# Patient Record
Sex: Male | Born: 1945 | Race: White | Marital: Single | Smoking: Former smoker
Health system: Southern US, Community
[De-identification: ages and names within clinical notes are randomized; demographics above are authoritative.]

## PROBLEM LIST (undated history)

## (undated) DIAGNOSIS — F319 Bipolar disorder, unspecified: Secondary | ICD-10-CM

## (undated) DIAGNOSIS — N200 Calculus of kidney: Secondary | ICD-10-CM

## (undated) DIAGNOSIS — N4289 Other specified disorders of prostate: Secondary | ICD-10-CM

## (undated) DIAGNOSIS — J449 Chronic obstructive pulmonary disease, unspecified: Secondary | ICD-10-CM

## (undated) DIAGNOSIS — N289 Disorder of kidney and ureter, unspecified: Secondary | ICD-10-CM

## (undated) DIAGNOSIS — I10 Essential (primary) hypertension: Secondary | ICD-10-CM

## (undated) DIAGNOSIS — E119 Type 2 diabetes mellitus without complications: Secondary | ICD-10-CM

## (undated) HISTORY — PX: WRIST SURGERY: SHX841

## (undated) HISTORY — PX: NASAL SINUS SURGERY: SHX719

## (undated) HISTORY — PX: APPENDECTOMY: SHX54

---

## 2011-02-27 DIAGNOSIS — F319 Bipolar disorder, unspecified: Secondary | ICD-10-CM | POA: Diagnosis not present

## 2011-02-27 DIAGNOSIS — J449 Chronic obstructive pulmonary disease, unspecified: Secondary | ICD-10-CM | POA: Diagnosis not present

## 2011-02-27 DIAGNOSIS — F411 Generalized anxiety disorder: Secondary | ICD-10-CM | POA: Diagnosis not present

## 2011-02-27 DIAGNOSIS — E785 Hyperlipidemia, unspecified: Secondary | ICD-10-CM | POA: Diagnosis not present

## 2011-02-27 DIAGNOSIS — I10 Essential (primary) hypertension: Secondary | ICD-10-CM | POA: Diagnosis not present

## 2011-03-05 DIAGNOSIS — G459 Transient cerebral ischemic attack, unspecified: Secondary | ICD-10-CM | POA: Diagnosis not present

## 2011-03-05 DIAGNOSIS — M5137 Other intervertebral disc degeneration, lumbosacral region: Secondary | ICD-10-CM | POA: Diagnosis not present

## 2011-03-05 DIAGNOSIS — R262 Difficulty in walking, not elsewhere classified: Secondary | ICD-10-CM | POA: Diagnosis not present

## 2011-03-06 DIAGNOSIS — M5137 Other intervertebral disc degeneration, lumbosacral region: Secondary | ICD-10-CM | POA: Diagnosis not present

## 2011-03-06 DIAGNOSIS — R262 Difficulty in walking, not elsewhere classified: Secondary | ICD-10-CM | POA: Diagnosis not present

## 2011-03-06 DIAGNOSIS — R279 Unspecified lack of coordination: Secondary | ICD-10-CM | POA: Diagnosis not present

## 2011-03-06 DIAGNOSIS — IMO0001 Reserved for inherently not codable concepts without codable children: Secondary | ICD-10-CM | POA: Diagnosis not present

## 2011-03-06 DIAGNOSIS — M6281 Muscle weakness (generalized): Secondary | ICD-10-CM | POA: Diagnosis not present

## 2011-03-06 DIAGNOSIS — M545 Low back pain, unspecified: Secondary | ICD-10-CM | POA: Diagnosis not present

## 2011-03-06 DIAGNOSIS — M2569 Stiffness of other specified joint, not elsewhere classified: Secondary | ICD-10-CM | POA: Diagnosis not present

## 2011-03-06 DIAGNOSIS — R293 Abnormal posture: Secondary | ICD-10-CM | POA: Diagnosis not present

## 2011-05-30 DIAGNOSIS — I10 Essential (primary) hypertension: Secondary | ICD-10-CM | POA: Diagnosis not present

## 2011-05-30 DIAGNOSIS — N400 Enlarged prostate without lower urinary tract symptoms: Secondary | ICD-10-CM | POA: Diagnosis not present

## 2011-05-30 DIAGNOSIS — E785 Hyperlipidemia, unspecified: Secondary | ICD-10-CM | POA: Diagnosis not present

## 2011-06-11 DIAGNOSIS — H109 Unspecified conjunctivitis: Secondary | ICD-10-CM | POA: Diagnosis not present

## 2011-06-12 DIAGNOSIS — F3189 Other bipolar disorder: Secondary | ICD-10-CM | POA: Diagnosis not present

## 2011-08-27 DIAGNOSIS — E785 Hyperlipidemia, unspecified: Secondary | ICD-10-CM | POA: Diagnosis not present

## 2011-08-27 DIAGNOSIS — I10 Essential (primary) hypertension: Secondary | ICD-10-CM | POA: Diagnosis not present

## 2011-08-27 DIAGNOSIS — J449 Chronic obstructive pulmonary disease, unspecified: Secondary | ICD-10-CM | POA: Diagnosis not present

## 2011-08-27 DIAGNOSIS — H9190 Unspecified hearing loss, unspecified ear: Secondary | ICD-10-CM | POA: Diagnosis not present

## 2011-08-29 DIAGNOSIS — R911 Solitary pulmonary nodule: Secondary | ICD-10-CM | POA: Diagnosis not present

## 2011-08-31 DIAGNOSIS — H903 Sensorineural hearing loss, bilateral: Secondary | ICD-10-CM | POA: Diagnosis not present

## 2011-09-04 DIAGNOSIS — F3189 Other bipolar disorder: Secondary | ICD-10-CM | POA: Diagnosis not present

## 2011-11-12 DIAGNOSIS — Z23 Encounter for immunization: Secondary | ICD-10-CM | POA: Diagnosis not present

## 2011-11-13 DIAGNOSIS — F3189 Other bipolar disorder: Secondary | ICD-10-CM | POA: Diagnosis not present

## 2011-11-26 DIAGNOSIS — J449 Chronic obstructive pulmonary disease, unspecified: Secondary | ICD-10-CM | POA: Diagnosis not present

## 2011-11-26 DIAGNOSIS — I10 Essential (primary) hypertension: Secondary | ICD-10-CM | POA: Diagnosis not present

## 2011-11-26 DIAGNOSIS — F319 Bipolar disorder, unspecified: Secondary | ICD-10-CM | POA: Diagnosis not present

## 2011-11-26 DIAGNOSIS — K219 Gastro-esophageal reflux disease without esophagitis: Secondary | ICD-10-CM | POA: Diagnosis not present

## 2011-11-26 DIAGNOSIS — E785 Hyperlipidemia, unspecified: Secondary | ICD-10-CM | POA: Diagnosis not present

## 2011-11-26 DIAGNOSIS — M545 Low back pain: Secondary | ICD-10-CM | POA: Diagnosis not present

## 2011-11-26 DIAGNOSIS — H9190 Unspecified hearing loss, unspecified ear: Secondary | ICD-10-CM | POA: Diagnosis not present

## 2011-12-20 DIAGNOSIS — L219 Seborrheic dermatitis, unspecified: Secondary | ICD-10-CM | POA: Diagnosis not present

## 2012-01-17 DIAGNOSIS — R0789 Other chest pain: Secondary | ICD-10-CM | POA: Diagnosis not present

## 2012-01-17 DIAGNOSIS — F411 Generalized anxiety disorder: Secondary | ICD-10-CM | POA: Diagnosis not present

## 2012-01-17 DIAGNOSIS — M62838 Other muscle spasm: Secondary | ICD-10-CM | POA: Diagnosis not present

## 2012-02-28 DIAGNOSIS — G459 Transient cerebral ischemic attack, unspecified: Secondary | ICD-10-CM | POA: Diagnosis not present

## 2012-03-03 DIAGNOSIS — F411 Generalized anxiety disorder: Secondary | ICD-10-CM | POA: Diagnosis not present

## 2012-03-03 DIAGNOSIS — F410 Panic disorder [episodic paroxysmal anxiety] without agoraphobia: Secondary | ICD-10-CM | POA: Diagnosis not present

## 2012-03-03 DIAGNOSIS — F319 Bipolar disorder, unspecified: Secondary | ICD-10-CM | POA: Diagnosis not present

## 2012-03-03 DIAGNOSIS — K219 Gastro-esophageal reflux disease without esophagitis: Secondary | ICD-10-CM | POA: Diagnosis not present

## 2012-03-03 DIAGNOSIS — I10 Essential (primary) hypertension: Secondary | ICD-10-CM | POA: Diagnosis not present

## 2012-03-03 DIAGNOSIS — E785 Hyperlipidemia, unspecified: Secondary | ICD-10-CM | POA: Diagnosis not present

## 2012-04-07 DIAGNOSIS — Z8673 Personal history of transient ischemic attack (TIA), and cerebral infarction without residual deficits: Secondary | ICD-10-CM | POA: Diagnosis not present

## 2012-04-07 DIAGNOSIS — I10 Essential (primary) hypertension: Secondary | ICD-10-CM | POA: Diagnosis not present

## 2012-04-07 DIAGNOSIS — F410 Panic disorder [episodic paroxysmal anxiety] without agoraphobia: Secondary | ICD-10-CM | POA: Diagnosis not present

## 2012-04-07 DIAGNOSIS — E785 Hyperlipidemia, unspecified: Secondary | ICD-10-CM | POA: Diagnosis not present

## 2012-04-07 DIAGNOSIS — I200 Unstable angina: Secondary | ICD-10-CM | POA: Diagnosis not present

## 2012-04-07 DIAGNOSIS — J440 Chronic obstructive pulmonary disease with acute lower respiratory infection: Secondary | ICD-10-CM | POA: Diagnosis not present

## 2012-04-07 DIAGNOSIS — K219 Gastro-esophageal reflux disease without esophagitis: Secondary | ICD-10-CM | POA: Diagnosis not present

## 2012-04-07 DIAGNOSIS — R072 Precordial pain: Secondary | ICD-10-CM | POA: Diagnosis not present

## 2012-04-07 DIAGNOSIS — I739 Peripheral vascular disease, unspecified: Secondary | ICD-10-CM | POA: Diagnosis not present

## 2012-04-07 DIAGNOSIS — J441 Chronic obstructive pulmonary disease with (acute) exacerbation: Secondary | ICD-10-CM | POA: Diagnosis not present

## 2012-04-07 DIAGNOSIS — Z79899 Other long term (current) drug therapy: Secondary | ICD-10-CM | POA: Diagnosis not present

## 2012-04-07 DIAGNOSIS — Z905 Acquired absence of kidney: Secondary | ICD-10-CM | POA: Diagnosis not present

## 2012-04-07 DIAGNOSIS — Z7902 Long term (current) use of antithrombotics/antiplatelets: Secondary | ICD-10-CM | POA: Diagnosis not present

## 2012-04-07 DIAGNOSIS — R0789 Other chest pain: Secondary | ICD-10-CM | POA: Diagnosis not present

## 2012-04-07 DIAGNOSIS — F17200 Nicotine dependence, unspecified, uncomplicated: Secondary | ICD-10-CM | POA: Diagnosis not present

## 2012-04-08 DIAGNOSIS — F17200 Nicotine dependence, unspecified, uncomplicated: Secondary | ICD-10-CM | POA: Diagnosis not present

## 2012-04-08 DIAGNOSIS — J440 Chronic obstructive pulmonary disease with acute lower respiratory infection: Secondary | ICD-10-CM | POA: Diagnosis not present

## 2012-04-08 DIAGNOSIS — R0789 Other chest pain: Secondary | ICD-10-CM | POA: Diagnosis not present

## 2012-04-08 DIAGNOSIS — R9431 Abnormal electrocardiogram [ECG] [EKG]: Secondary | ICD-10-CM | POA: Diagnosis not present

## 2012-04-22 DIAGNOSIS — J449 Chronic obstructive pulmonary disease, unspecified: Secondary | ICD-10-CM | POA: Diagnosis not present

## 2012-04-22 DIAGNOSIS — I209 Angina pectoris, unspecified: Secondary | ICD-10-CM | POA: Diagnosis not present

## 2012-04-30 DIAGNOSIS — I10 Essential (primary) hypertension: Secondary | ICD-10-CM | POA: Diagnosis not present

## 2012-04-30 DIAGNOSIS — J449 Chronic obstructive pulmonary disease, unspecified: Secondary | ICD-10-CM | POA: Diagnosis not present

## 2012-04-30 DIAGNOSIS — R079 Chest pain, unspecified: Secondary | ICD-10-CM | POA: Diagnosis not present

## 2012-06-02 DIAGNOSIS — I10 Essential (primary) hypertension: Secondary | ICD-10-CM | POA: Diagnosis not present

## 2012-06-02 DIAGNOSIS — J449 Chronic obstructive pulmonary disease, unspecified: Secondary | ICD-10-CM | POA: Diagnosis not present

## 2012-06-02 DIAGNOSIS — F17200 Nicotine dependence, unspecified, uncomplicated: Secondary | ICD-10-CM | POA: Diagnosis not present

## 2012-06-02 DIAGNOSIS — E785 Hyperlipidemia, unspecified: Secondary | ICD-10-CM | POA: Diagnosis not present

## 2012-08-08 DIAGNOSIS — G459 Transient cerebral ischemic attack, unspecified: Secondary | ICD-10-CM | POA: Diagnosis not present

## 2012-08-08 DIAGNOSIS — IMO0001 Reserved for inherently not codable concepts without codable children: Secondary | ICD-10-CM | POA: Diagnosis not present

## 2012-09-01 DIAGNOSIS — IMO0001 Reserved for inherently not codable concepts without codable children: Secondary | ICD-10-CM | POA: Diagnosis not present

## 2012-09-01 DIAGNOSIS — I10 Essential (primary) hypertension: Secondary | ICD-10-CM | POA: Diagnosis not present

## 2012-09-01 DIAGNOSIS — R5383 Other fatigue: Secondary | ICD-10-CM | POA: Diagnosis not present

## 2012-09-01 DIAGNOSIS — J449 Chronic obstructive pulmonary disease, unspecified: Secondary | ICD-10-CM | POA: Diagnosis not present

## 2012-09-01 DIAGNOSIS — E785 Hyperlipidemia, unspecified: Secondary | ICD-10-CM | POA: Diagnosis not present

## 2012-09-01 DIAGNOSIS — R269 Unspecified abnormalities of gait and mobility: Secondary | ICD-10-CM | POA: Diagnosis not present

## 2012-09-01 DIAGNOSIS — R5381 Other malaise: Secondary | ICD-10-CM | POA: Diagnosis not present

## 2012-11-12 DIAGNOSIS — Z23 Encounter for immunization: Secondary | ICD-10-CM | POA: Diagnosis not present

## 2012-12-01 DIAGNOSIS — E785 Hyperlipidemia, unspecified: Secondary | ICD-10-CM | POA: Diagnosis not present

## 2012-12-01 DIAGNOSIS — F319 Bipolar disorder, unspecified: Secondary | ICD-10-CM | POA: Diagnosis not present

## 2012-12-01 DIAGNOSIS — N289 Disorder of kidney and ureter, unspecified: Secondary | ICD-10-CM | POA: Diagnosis not present

## 2012-12-01 DIAGNOSIS — I10 Essential (primary) hypertension: Secondary | ICD-10-CM | POA: Diagnosis not present

## 2012-12-01 DIAGNOSIS — Z23 Encounter for immunization: Secondary | ICD-10-CM | POA: Diagnosis not present

## 2012-12-01 DIAGNOSIS — K219 Gastro-esophageal reflux disease without esophagitis: Secondary | ICD-10-CM | POA: Diagnosis not present

## 2012-12-05 DIAGNOSIS — I714 Abdominal aortic aneurysm, without rupture: Secondary | ICD-10-CM | POA: Diagnosis not present

## 2012-12-05 DIAGNOSIS — I722 Aneurysm of renal artery: Secondary | ICD-10-CM | POA: Diagnosis not present

## 2013-03-06 DIAGNOSIS — I10 Essential (primary) hypertension: Secondary | ICD-10-CM | POA: Diagnosis not present

## 2013-03-06 DIAGNOSIS — Z125 Encounter for screening for malignant neoplasm of prostate: Secondary | ICD-10-CM | POA: Diagnosis not present

## 2013-03-06 DIAGNOSIS — M549 Dorsalgia, unspecified: Secondary | ICD-10-CM | POA: Diagnosis not present

## 2013-03-06 DIAGNOSIS — K219 Gastro-esophageal reflux disease without esophagitis: Secondary | ICD-10-CM | POA: Diagnosis not present

## 2013-03-06 DIAGNOSIS — N289 Disorder of kidney and ureter, unspecified: Secondary | ICD-10-CM | POA: Diagnosis not present

## 2013-03-06 DIAGNOSIS — F319 Bipolar disorder, unspecified: Secondary | ICD-10-CM | POA: Diagnosis not present

## 2013-03-06 DIAGNOSIS — E785 Hyperlipidemia, unspecified: Secondary | ICD-10-CM | POA: Diagnosis not present

## 2013-07-09 DIAGNOSIS — F3189 Other bipolar disorder: Secondary | ICD-10-CM | POA: Diagnosis not present

## 2013-07-09 DIAGNOSIS — E785 Hyperlipidemia, unspecified: Secondary | ICD-10-CM | POA: Diagnosis not present

## 2013-07-09 DIAGNOSIS — F319 Bipolar disorder, unspecified: Secondary | ICD-10-CM | POA: Diagnosis not present

## 2013-07-09 DIAGNOSIS — I10 Essential (primary) hypertension: Secondary | ICD-10-CM | POA: Diagnosis not present

## 2013-07-09 DIAGNOSIS — K219 Gastro-esophageal reflux disease without esophagitis: Secondary | ICD-10-CM | POA: Diagnosis not present

## 2013-07-09 DIAGNOSIS — N289 Disorder of kidney and ureter, unspecified: Secondary | ICD-10-CM | POA: Diagnosis not present

## 2013-07-15 DIAGNOSIS — Z1211 Encounter for screening for malignant neoplasm of colon: Secondary | ICD-10-CM | POA: Diagnosis not present

## 2013-08-07 DIAGNOSIS — R42 Dizziness and giddiness: Secondary | ICD-10-CM | POA: Diagnosis not present

## 2013-08-07 DIAGNOSIS — G459 Transient cerebral ischemic attack, unspecified: Secondary | ICD-10-CM | POA: Diagnosis not present

## 2013-10-28 DIAGNOSIS — Z23 Encounter for immunization: Secondary | ICD-10-CM | POA: Diagnosis not present

## 2013-11-06 DIAGNOSIS — F319 Bipolar disorder, unspecified: Secondary | ICD-10-CM | POA: Diagnosis not present

## 2013-11-06 DIAGNOSIS — K219 Gastro-esophageal reflux disease without esophagitis: Secondary | ICD-10-CM | POA: Diagnosis not present

## 2013-11-06 DIAGNOSIS — IMO0002 Reserved for concepts with insufficient information to code with codable children: Secondary | ICD-10-CM | POA: Diagnosis not present

## 2013-11-06 DIAGNOSIS — I10 Essential (primary) hypertension: Secondary | ICD-10-CM | POA: Diagnosis not present

## 2013-11-06 DIAGNOSIS — E785 Hyperlipidemia, unspecified: Secondary | ICD-10-CM | POA: Diagnosis not present

## 2014-01-21 DIAGNOSIS — Z6830 Body mass index (BMI) 30.0-30.9, adult: Secondary | ICD-10-CM | POA: Diagnosis not present

## 2014-01-21 DIAGNOSIS — K219 Gastro-esophageal reflux disease without esophagitis: Secondary | ICD-10-CM | POA: Diagnosis not present

## 2014-03-10 DIAGNOSIS — Z6824 Body mass index (BMI) 24.0-24.9, adult: Secondary | ICD-10-CM | POA: Diagnosis not present

## 2014-03-10 DIAGNOSIS — E785 Hyperlipidemia, unspecified: Secondary | ICD-10-CM | POA: Diagnosis not present

## 2014-03-10 DIAGNOSIS — I10 Essential (primary) hypertension: Secondary | ICD-10-CM | POA: Diagnosis not present

## 2014-03-10 DIAGNOSIS — K219 Gastro-esophageal reflux disease without esophagitis: Secondary | ICD-10-CM | POA: Diagnosis not present

## 2014-05-24 DIAGNOSIS — K219 Gastro-esophageal reflux disease without esophagitis: Secondary | ICD-10-CM | POA: Diagnosis not present

## 2014-05-24 DIAGNOSIS — Z6825 Body mass index (BMI) 25.0-25.9, adult: Secondary | ICD-10-CM | POA: Diagnosis not present

## 2014-05-24 DIAGNOSIS — R079 Chest pain, unspecified: Secondary | ICD-10-CM | POA: Diagnosis not present

## 2014-05-24 DIAGNOSIS — K224 Dyskinesia of esophagus: Secondary | ICD-10-CM | POA: Diagnosis not present

## 2014-06-10 DIAGNOSIS — F319 Bipolar disorder, unspecified: Secondary | ICD-10-CM | POA: Diagnosis not present

## 2014-06-10 DIAGNOSIS — Z6825 Body mass index (BMI) 25.0-25.9, adult: Secondary | ICD-10-CM | POA: Diagnosis not present

## 2014-06-10 DIAGNOSIS — Z125 Encounter for screening for malignant neoplasm of prostate: Secondary | ICD-10-CM | POA: Diagnosis not present

## 2014-06-10 DIAGNOSIS — J449 Chronic obstructive pulmonary disease, unspecified: Secondary | ICD-10-CM | POA: Diagnosis not present

## 2014-06-10 DIAGNOSIS — Z720 Tobacco use: Secondary | ICD-10-CM | POA: Diagnosis not present

## 2014-06-10 DIAGNOSIS — E785 Hyperlipidemia, unspecified: Secondary | ICD-10-CM | POA: Diagnosis not present

## 2014-06-10 DIAGNOSIS — I10 Essential (primary) hypertension: Secondary | ICD-10-CM | POA: Diagnosis not present

## 2014-08-06 DIAGNOSIS — G459 Transient cerebral ischemic attack, unspecified: Secondary | ICD-10-CM | POA: Diagnosis not present

## 2014-08-06 DIAGNOSIS — R269 Unspecified abnormalities of gait and mobility: Secondary | ICD-10-CM | POA: Diagnosis not present

## 2014-09-08 DIAGNOSIS — R2689 Other abnormalities of gait and mobility: Secondary | ICD-10-CM | POA: Diagnosis not present

## 2014-09-08 DIAGNOSIS — E785 Hyperlipidemia, unspecified: Secondary | ICD-10-CM | POA: Diagnosis not present

## 2014-09-08 DIAGNOSIS — F319 Bipolar disorder, unspecified: Secondary | ICD-10-CM | POA: Diagnosis not present

## 2014-09-08 DIAGNOSIS — G459 Transient cerebral ischemic attack, unspecified: Secondary | ICD-10-CM | POA: Diagnosis not present

## 2014-09-08 DIAGNOSIS — J449 Chronic obstructive pulmonary disease, unspecified: Secondary | ICD-10-CM | POA: Diagnosis not present

## 2014-09-08 DIAGNOSIS — Z6825 Body mass index (BMI) 25.0-25.9, adult: Secondary | ICD-10-CM | POA: Diagnosis not present

## 2014-09-08 DIAGNOSIS — I10 Essential (primary) hypertension: Secondary | ICD-10-CM | POA: Diagnosis not present

## 2014-09-08 DIAGNOSIS — Z9181 History of falling: Secondary | ICD-10-CM | POA: Diagnosis not present

## 2014-09-10 DIAGNOSIS — Z23 Encounter for immunization: Secondary | ICD-10-CM | POA: Diagnosis not present

## 2014-09-17 DIAGNOSIS — J449 Chronic obstructive pulmonary disease, unspecified: Secondary | ICD-10-CM | POA: Diagnosis not present

## 2014-09-17 DIAGNOSIS — F17210 Nicotine dependence, cigarettes, uncomplicated: Secondary | ICD-10-CM | POA: Diagnosis not present

## 2014-09-17 DIAGNOSIS — I10 Essential (primary) hypertension: Secondary | ICD-10-CM | POA: Diagnosis not present

## 2014-09-17 DIAGNOSIS — F319 Bipolar disorder, unspecified: Secondary | ICD-10-CM | POA: Diagnosis not present

## 2014-09-17 DIAGNOSIS — E1140 Type 2 diabetes mellitus with diabetic neuropathy, unspecified: Secondary | ICD-10-CM | POA: Diagnosis not present

## 2014-09-17 DIAGNOSIS — M5030 Other cervical disc degeneration, unspecified cervical region: Secondary | ICD-10-CM | POA: Diagnosis not present

## 2014-09-21 DIAGNOSIS — J449 Chronic obstructive pulmonary disease, unspecified: Secondary | ICD-10-CM | POA: Diagnosis not present

## 2014-09-21 DIAGNOSIS — F319 Bipolar disorder, unspecified: Secondary | ICD-10-CM | POA: Diagnosis not present

## 2014-09-21 DIAGNOSIS — E1140 Type 2 diabetes mellitus with diabetic neuropathy, unspecified: Secondary | ICD-10-CM | POA: Diagnosis not present

## 2014-09-21 DIAGNOSIS — I10 Essential (primary) hypertension: Secondary | ICD-10-CM | POA: Diagnosis not present

## 2014-09-21 DIAGNOSIS — M5030 Other cervical disc degeneration, unspecified cervical region: Secondary | ICD-10-CM | POA: Diagnosis not present

## 2014-09-21 DIAGNOSIS — F17210 Nicotine dependence, cigarettes, uncomplicated: Secondary | ICD-10-CM | POA: Diagnosis not present

## 2014-09-22 DIAGNOSIS — M5030 Other cervical disc degeneration, unspecified cervical region: Secondary | ICD-10-CM | POA: Diagnosis not present

## 2014-09-22 DIAGNOSIS — E1140 Type 2 diabetes mellitus with diabetic neuropathy, unspecified: Secondary | ICD-10-CM | POA: Diagnosis not present

## 2014-09-22 DIAGNOSIS — I10 Essential (primary) hypertension: Secondary | ICD-10-CM | POA: Diagnosis not present

## 2014-09-22 DIAGNOSIS — F17210 Nicotine dependence, cigarettes, uncomplicated: Secondary | ICD-10-CM | POA: Diagnosis not present

## 2014-09-22 DIAGNOSIS — F319 Bipolar disorder, unspecified: Secondary | ICD-10-CM | POA: Diagnosis not present

## 2014-09-22 DIAGNOSIS — J449 Chronic obstructive pulmonary disease, unspecified: Secondary | ICD-10-CM | POA: Diagnosis not present

## 2014-09-27 DIAGNOSIS — I10 Essential (primary) hypertension: Secondary | ICD-10-CM | POA: Diagnosis not present

## 2014-09-28 DIAGNOSIS — E1140 Type 2 diabetes mellitus with diabetic neuropathy, unspecified: Secondary | ICD-10-CM | POA: Diagnosis not present

## 2014-09-28 DIAGNOSIS — F17210 Nicotine dependence, cigarettes, uncomplicated: Secondary | ICD-10-CM | POA: Diagnosis not present

## 2014-09-28 DIAGNOSIS — M5030 Other cervical disc degeneration, unspecified cervical region: Secondary | ICD-10-CM | POA: Diagnosis not present

## 2014-09-28 DIAGNOSIS — F319 Bipolar disorder, unspecified: Secondary | ICD-10-CM | POA: Diagnosis not present

## 2014-09-28 DIAGNOSIS — I10 Essential (primary) hypertension: Secondary | ICD-10-CM | POA: Diagnosis not present

## 2014-09-28 DIAGNOSIS — J449 Chronic obstructive pulmonary disease, unspecified: Secondary | ICD-10-CM | POA: Diagnosis not present

## 2014-09-30 DIAGNOSIS — J449 Chronic obstructive pulmonary disease, unspecified: Secondary | ICD-10-CM | POA: Diagnosis not present

## 2014-09-30 DIAGNOSIS — M5030 Other cervical disc degeneration, unspecified cervical region: Secondary | ICD-10-CM | POA: Diagnosis not present

## 2014-09-30 DIAGNOSIS — E1140 Type 2 diabetes mellitus with diabetic neuropathy, unspecified: Secondary | ICD-10-CM | POA: Diagnosis not present

## 2014-09-30 DIAGNOSIS — F319 Bipolar disorder, unspecified: Secondary | ICD-10-CM | POA: Diagnosis not present

## 2014-09-30 DIAGNOSIS — F17210 Nicotine dependence, cigarettes, uncomplicated: Secondary | ICD-10-CM | POA: Diagnosis not present

## 2014-09-30 DIAGNOSIS — I10 Essential (primary) hypertension: Secondary | ICD-10-CM | POA: Diagnosis not present

## 2014-10-05 DIAGNOSIS — F17210 Nicotine dependence, cigarettes, uncomplicated: Secondary | ICD-10-CM | POA: Diagnosis not present

## 2014-10-05 DIAGNOSIS — J449 Chronic obstructive pulmonary disease, unspecified: Secondary | ICD-10-CM | POA: Diagnosis not present

## 2014-10-05 DIAGNOSIS — E1140 Type 2 diabetes mellitus with diabetic neuropathy, unspecified: Secondary | ICD-10-CM | POA: Diagnosis not present

## 2014-10-05 DIAGNOSIS — F319 Bipolar disorder, unspecified: Secondary | ICD-10-CM | POA: Diagnosis not present

## 2014-10-05 DIAGNOSIS — M5030 Other cervical disc degeneration, unspecified cervical region: Secondary | ICD-10-CM | POA: Diagnosis not present

## 2014-10-05 DIAGNOSIS — I10 Essential (primary) hypertension: Secondary | ICD-10-CM | POA: Diagnosis not present

## 2014-10-07 DIAGNOSIS — M5030 Other cervical disc degeneration, unspecified cervical region: Secondary | ICD-10-CM | POA: Diagnosis not present

## 2014-10-07 DIAGNOSIS — J449 Chronic obstructive pulmonary disease, unspecified: Secondary | ICD-10-CM | POA: Diagnosis not present

## 2014-10-07 DIAGNOSIS — F17210 Nicotine dependence, cigarettes, uncomplicated: Secondary | ICD-10-CM | POA: Diagnosis not present

## 2014-10-07 DIAGNOSIS — F319 Bipolar disorder, unspecified: Secondary | ICD-10-CM | POA: Diagnosis not present

## 2014-10-07 DIAGNOSIS — I10 Essential (primary) hypertension: Secondary | ICD-10-CM | POA: Diagnosis not present

## 2014-10-07 DIAGNOSIS — E1140 Type 2 diabetes mellitus with diabetic neuropathy, unspecified: Secondary | ICD-10-CM | POA: Diagnosis not present

## 2014-10-12 DIAGNOSIS — E1140 Type 2 diabetes mellitus with diabetic neuropathy, unspecified: Secondary | ICD-10-CM | POA: Diagnosis not present

## 2014-10-12 DIAGNOSIS — F17210 Nicotine dependence, cigarettes, uncomplicated: Secondary | ICD-10-CM | POA: Diagnosis not present

## 2014-10-12 DIAGNOSIS — J449 Chronic obstructive pulmonary disease, unspecified: Secondary | ICD-10-CM | POA: Diagnosis not present

## 2014-10-12 DIAGNOSIS — F319 Bipolar disorder, unspecified: Secondary | ICD-10-CM | POA: Diagnosis not present

## 2014-10-12 DIAGNOSIS — I10 Essential (primary) hypertension: Secondary | ICD-10-CM | POA: Diagnosis not present

## 2014-10-12 DIAGNOSIS — M5030 Other cervical disc degeneration, unspecified cervical region: Secondary | ICD-10-CM | POA: Diagnosis not present

## 2014-10-14 DIAGNOSIS — E1140 Type 2 diabetes mellitus with diabetic neuropathy, unspecified: Secondary | ICD-10-CM | POA: Diagnosis not present

## 2014-10-14 DIAGNOSIS — I10 Essential (primary) hypertension: Secondary | ICD-10-CM | POA: Diagnosis not present

## 2014-10-14 DIAGNOSIS — F17210 Nicotine dependence, cigarettes, uncomplicated: Secondary | ICD-10-CM | POA: Diagnosis not present

## 2014-10-14 DIAGNOSIS — F319 Bipolar disorder, unspecified: Secondary | ICD-10-CM | POA: Diagnosis not present

## 2014-10-14 DIAGNOSIS — J449 Chronic obstructive pulmonary disease, unspecified: Secondary | ICD-10-CM | POA: Diagnosis not present

## 2014-10-14 DIAGNOSIS — M5030 Other cervical disc degeneration, unspecified cervical region: Secondary | ICD-10-CM | POA: Diagnosis not present

## 2014-10-20 DIAGNOSIS — M5030 Other cervical disc degeneration, unspecified cervical region: Secondary | ICD-10-CM | POA: Diagnosis not present

## 2014-10-20 DIAGNOSIS — F319 Bipolar disorder, unspecified: Secondary | ICD-10-CM | POA: Diagnosis not present

## 2014-10-20 DIAGNOSIS — J449 Chronic obstructive pulmonary disease, unspecified: Secondary | ICD-10-CM | POA: Diagnosis not present

## 2014-10-20 DIAGNOSIS — I10 Essential (primary) hypertension: Secondary | ICD-10-CM | POA: Diagnosis not present

## 2014-10-20 DIAGNOSIS — E1140 Type 2 diabetes mellitus with diabetic neuropathy, unspecified: Secondary | ICD-10-CM | POA: Diagnosis not present

## 2014-10-20 DIAGNOSIS — F17210 Nicotine dependence, cigarettes, uncomplicated: Secondary | ICD-10-CM | POA: Diagnosis not present

## 2014-10-22 DIAGNOSIS — M5030 Other cervical disc degeneration, unspecified cervical region: Secondary | ICD-10-CM | POA: Diagnosis not present

## 2014-10-22 DIAGNOSIS — I10 Essential (primary) hypertension: Secondary | ICD-10-CM | POA: Diagnosis not present

## 2014-10-22 DIAGNOSIS — J449 Chronic obstructive pulmonary disease, unspecified: Secondary | ICD-10-CM | POA: Diagnosis not present

## 2014-10-22 DIAGNOSIS — F17210 Nicotine dependence, cigarettes, uncomplicated: Secondary | ICD-10-CM | POA: Diagnosis not present

## 2014-10-22 DIAGNOSIS — E1140 Type 2 diabetes mellitus with diabetic neuropathy, unspecified: Secondary | ICD-10-CM | POA: Diagnosis not present

## 2014-10-22 DIAGNOSIS — F319 Bipolar disorder, unspecified: Secondary | ICD-10-CM | POA: Diagnosis not present

## 2014-10-29 DIAGNOSIS — E1140 Type 2 diabetes mellitus with diabetic neuropathy, unspecified: Secondary | ICD-10-CM | POA: Diagnosis not present

## 2014-10-29 DIAGNOSIS — I10 Essential (primary) hypertension: Secondary | ICD-10-CM | POA: Diagnosis not present

## 2014-10-29 DIAGNOSIS — F17210 Nicotine dependence, cigarettes, uncomplicated: Secondary | ICD-10-CM | POA: Diagnosis not present

## 2014-10-29 DIAGNOSIS — M5030 Other cervical disc degeneration, unspecified cervical region: Secondary | ICD-10-CM | POA: Diagnosis not present

## 2014-10-29 DIAGNOSIS — J449 Chronic obstructive pulmonary disease, unspecified: Secondary | ICD-10-CM | POA: Diagnosis not present

## 2014-10-29 DIAGNOSIS — F319 Bipolar disorder, unspecified: Secondary | ICD-10-CM | POA: Diagnosis not present

## 2014-11-01 DIAGNOSIS — E1140 Type 2 diabetes mellitus with diabetic neuropathy, unspecified: Secondary | ICD-10-CM | POA: Diagnosis not present

## 2014-11-01 DIAGNOSIS — J449 Chronic obstructive pulmonary disease, unspecified: Secondary | ICD-10-CM | POA: Diagnosis not present

## 2014-11-01 DIAGNOSIS — I10 Essential (primary) hypertension: Secondary | ICD-10-CM | POA: Diagnosis not present

## 2014-11-01 DIAGNOSIS — F17210 Nicotine dependence, cigarettes, uncomplicated: Secondary | ICD-10-CM | POA: Diagnosis not present

## 2014-11-01 DIAGNOSIS — Z23 Encounter for immunization: Secondary | ICD-10-CM | POA: Diagnosis not present

## 2014-11-01 DIAGNOSIS — F319 Bipolar disorder, unspecified: Secondary | ICD-10-CM | POA: Diagnosis not present

## 2014-11-01 DIAGNOSIS — M5030 Other cervical disc degeneration, unspecified cervical region: Secondary | ICD-10-CM | POA: Diagnosis not present

## 2014-11-03 DIAGNOSIS — M5030 Other cervical disc degeneration, unspecified cervical region: Secondary | ICD-10-CM | POA: Diagnosis not present

## 2014-11-03 DIAGNOSIS — E1140 Type 2 diabetes mellitus with diabetic neuropathy, unspecified: Secondary | ICD-10-CM | POA: Diagnosis not present

## 2014-11-03 DIAGNOSIS — J449 Chronic obstructive pulmonary disease, unspecified: Secondary | ICD-10-CM | POA: Diagnosis not present

## 2014-11-03 DIAGNOSIS — F17210 Nicotine dependence, cigarettes, uncomplicated: Secondary | ICD-10-CM | POA: Diagnosis not present

## 2014-11-03 DIAGNOSIS — F319 Bipolar disorder, unspecified: Secondary | ICD-10-CM | POA: Diagnosis not present

## 2014-11-03 DIAGNOSIS — I10 Essential (primary) hypertension: Secondary | ICD-10-CM | POA: Diagnosis not present

## 2014-11-08 DIAGNOSIS — F17210 Nicotine dependence, cigarettes, uncomplicated: Secondary | ICD-10-CM | POA: Diagnosis not present

## 2014-11-08 DIAGNOSIS — F319 Bipolar disorder, unspecified: Secondary | ICD-10-CM | POA: Diagnosis not present

## 2014-11-08 DIAGNOSIS — E1140 Type 2 diabetes mellitus with diabetic neuropathy, unspecified: Secondary | ICD-10-CM | POA: Diagnosis not present

## 2014-11-08 DIAGNOSIS — M5030 Other cervical disc degeneration, unspecified cervical region: Secondary | ICD-10-CM | POA: Diagnosis not present

## 2014-11-08 DIAGNOSIS — I10 Essential (primary) hypertension: Secondary | ICD-10-CM | POA: Diagnosis not present

## 2014-11-08 DIAGNOSIS — J449 Chronic obstructive pulmonary disease, unspecified: Secondary | ICD-10-CM | POA: Diagnosis not present

## 2014-11-10 DIAGNOSIS — I10 Essential (primary) hypertension: Secondary | ICD-10-CM | POA: Diagnosis not present

## 2014-11-10 DIAGNOSIS — F319 Bipolar disorder, unspecified: Secondary | ICD-10-CM | POA: Diagnosis not present

## 2014-11-10 DIAGNOSIS — M5030 Other cervical disc degeneration, unspecified cervical region: Secondary | ICD-10-CM | POA: Diagnosis not present

## 2014-11-10 DIAGNOSIS — J449 Chronic obstructive pulmonary disease, unspecified: Secondary | ICD-10-CM | POA: Diagnosis not present

## 2014-11-10 DIAGNOSIS — F17210 Nicotine dependence, cigarettes, uncomplicated: Secondary | ICD-10-CM | POA: Diagnosis not present

## 2014-11-10 DIAGNOSIS — E1140 Type 2 diabetes mellitus with diabetic neuropathy, unspecified: Secondary | ICD-10-CM | POA: Diagnosis not present

## 2015-01-12 DIAGNOSIS — Z6824 Body mass index (BMI) 24.0-24.9, adult: Secondary | ICD-10-CM | POA: Diagnosis not present

## 2015-01-12 DIAGNOSIS — G459 Transient cerebral ischemic attack, unspecified: Secondary | ICD-10-CM | POA: Diagnosis not present

## 2015-01-12 DIAGNOSIS — E785 Hyperlipidemia, unspecified: Secondary | ICD-10-CM | POA: Diagnosis not present

## 2015-01-12 DIAGNOSIS — J449 Chronic obstructive pulmonary disease, unspecified: Secondary | ICD-10-CM | POA: Diagnosis not present

## 2015-01-12 DIAGNOSIS — Z125 Encounter for screening for malignant neoplasm of prostate: Secondary | ICD-10-CM | POA: Diagnosis not present

## 2015-01-12 DIAGNOSIS — I10 Essential (primary) hypertension: Secondary | ICD-10-CM | POA: Diagnosis not present

## 2015-01-12 DIAGNOSIS — Z905 Acquired absence of kidney: Secondary | ICD-10-CM | POA: Diagnosis not present

## 2015-01-12 DIAGNOSIS — F410 Panic disorder [episodic paroxysmal anxiety] without agoraphobia: Secondary | ICD-10-CM | POA: Diagnosis not present

## 2015-01-12 DIAGNOSIS — F319 Bipolar disorder, unspecified: Secondary | ICD-10-CM | POA: Diagnosis not present

## 2015-01-26 DIAGNOSIS — Z1211 Encounter for screening for malignant neoplasm of colon: Secondary | ICD-10-CM | POA: Diagnosis not present

## 2015-03-16 DIAGNOSIS — Z6823 Body mass index (BMI) 23.0-23.9, adult: Secondary | ICD-10-CM | POA: Diagnosis not present

## 2015-03-16 DIAGNOSIS — J441 Chronic obstructive pulmonary disease with (acute) exacerbation: Secondary | ICD-10-CM | POA: Diagnosis not present

## 2015-05-13 DIAGNOSIS — G459 Transient cerebral ischemic attack, unspecified: Secondary | ICD-10-CM | POA: Diagnosis not present

## 2015-05-13 DIAGNOSIS — F410 Panic disorder [episodic paroxysmal anxiety] without agoraphobia: Secondary | ICD-10-CM | POA: Diagnosis not present

## 2015-05-13 DIAGNOSIS — Z905 Acquired absence of kidney: Secondary | ICD-10-CM | POA: Diagnosis not present

## 2015-05-13 DIAGNOSIS — J449 Chronic obstructive pulmonary disease, unspecified: Secondary | ICD-10-CM | POA: Diagnosis not present

## 2015-05-13 DIAGNOSIS — E785 Hyperlipidemia, unspecified: Secondary | ICD-10-CM | POA: Diagnosis not present

## 2015-05-13 DIAGNOSIS — F319 Bipolar disorder, unspecified: Secondary | ICD-10-CM | POA: Diagnosis not present

## 2015-05-13 DIAGNOSIS — Z125 Encounter for screening for malignant neoplasm of prostate: Secondary | ICD-10-CM | POA: Diagnosis not present

## 2015-05-13 DIAGNOSIS — I10 Essential (primary) hypertension: Secondary | ICD-10-CM | POA: Diagnosis not present

## 2015-07-02 DIAGNOSIS — R109 Unspecified abdominal pain: Secondary | ICD-10-CM | POA: Diagnosis not present

## 2015-07-02 DIAGNOSIS — Z6826 Body mass index (BMI) 26.0-26.9, adult: Secondary | ICD-10-CM | POA: Diagnosis not present

## 2015-07-02 DIAGNOSIS — R143 Flatulence: Secondary | ICD-10-CM | POA: Diagnosis not present

## 2015-07-14 ENCOUNTER — Emergency Department (HOSPITAL_COMMUNITY)
Admission: EM | Admit: 2015-07-14 | Discharge: 2015-07-14 | Disposition: A | Discharge status: Home / Self Care | Payer: Medicare Other | Attending: Emergency Medicine | Admitting: Emergency Medicine

## 2015-07-14 ENCOUNTER — Encounter (HOSPITAL_COMMUNITY): Payer: Self-pay

## 2015-07-14 ENCOUNTER — Emergency Department (HOSPITAL_COMMUNITY): Payer: Medicare Other

## 2015-07-14 DIAGNOSIS — Z87448 Personal history of other diseases of urinary system: Secondary | ICD-10-CM | POA: Diagnosis not present

## 2015-07-14 DIAGNOSIS — R531 Weakness: Secondary | ICD-10-CM | POA: Diagnosis not present

## 2015-07-14 DIAGNOSIS — J449 Chronic obstructive pulmonary disease, unspecified: Secondary | ICD-10-CM | POA: Diagnosis not present

## 2015-07-14 DIAGNOSIS — Z87442 Personal history of urinary calculi: Secondary | ICD-10-CM | POA: Insufficient documentation

## 2015-07-14 DIAGNOSIS — Z880 Allergy status to penicillin: Secondary | ICD-10-CM | POA: Insufficient documentation

## 2015-07-14 DIAGNOSIS — R251 Tremor, unspecified: Secondary | ICD-10-CM | POA: Diagnosis not present

## 2015-07-14 DIAGNOSIS — H538 Other visual disturbances: Secondary | ICD-10-CM | POA: Insufficient documentation

## 2015-07-14 DIAGNOSIS — Z8659 Personal history of other mental and behavioral disorders: Secondary | ICD-10-CM | POA: Diagnosis not present

## 2015-07-14 DIAGNOSIS — I10 Essential (primary) hypertension: Secondary | ICD-10-CM | POA: Diagnosis not present

## 2015-07-14 DIAGNOSIS — Z87891 Personal history of nicotine dependence: Secondary | ICD-10-CM | POA: Insufficient documentation

## 2015-07-14 DIAGNOSIS — F1310 Sedative, hypnotic or anxiolytic abuse, uncomplicated: Secondary | ICD-10-CM | POA: Diagnosis not present

## 2015-07-14 DIAGNOSIS — Z87438 Personal history of other diseases of male genital organs: Secondary | ICD-10-CM | POA: Insufficient documentation

## 2015-07-14 DIAGNOSIS — R51 Headache: Secondary | ICD-10-CM | POA: Insufficient documentation

## 2015-07-14 DIAGNOSIS — E119 Type 2 diabetes mellitus without complications: Secondary | ICD-10-CM | POA: Diagnosis not present

## 2015-07-14 HISTORY — DX: Other specified disorders of prostate: N42.89

## 2015-07-14 HISTORY — DX: Type 2 diabetes mellitus without complications: E11.9

## 2015-07-14 HISTORY — DX: Disorder of kidney and ureter, unspecified: N28.9

## 2015-07-14 HISTORY — DX: Essential (primary) hypertension: I10

## 2015-07-14 HISTORY — DX: Bipolar disorder, unspecified: F31.9

## 2015-07-14 HISTORY — DX: Chronic obstructive pulmonary disease, unspecified: J44.9

## 2015-07-14 HISTORY — DX: Calculus of kidney: N20.0

## 2015-07-14 LAB — CBC WITH DIFFERENTIAL/PLATELET
BASOS ABS: 0 10*3/uL (ref 0.0–0.1)
BASOS PCT: 1 %
Eosinophils Absolute: 0.5 10*3/uL (ref 0.0–0.7)
Eosinophils Relative: 6 %
HEMATOCRIT: 37.3 % — AB (ref 39.0–52.0)
HEMOGLOBIN: 11.3 g/dL — AB (ref 13.0–17.0)
Lymphocytes Relative: 25 %
Lymphs Abs: 2.1 10*3/uL (ref 0.7–4.0)
MCH: 23.5 pg — ABNORMAL LOW (ref 26.0–34.0)
MCHC: 30.3 g/dL (ref 30.0–36.0)
MCV: 77.7 fL — ABNORMAL LOW (ref 78.0–100.0)
MONOS PCT: 5 %
Monocytes Absolute: 0.4 10*3/uL (ref 0.1–1.0)
NEUTROS ABS: 5.1 10*3/uL (ref 1.7–7.7)
NEUTROS PCT: 63 %
Platelets: 299 10*3/uL (ref 150–400)
RBC: 4.8 MIL/uL (ref 4.22–5.81)
RDW: 16.8 % — ABNORMAL HIGH (ref 11.5–15.5)
WBC: 8.1 10*3/uL (ref 4.0–10.5)

## 2015-07-14 LAB — RAPID URINE DRUG SCREEN, HOSP PERFORMED
AMPHETAMINES: NOT DETECTED
BARBITURATES: NOT DETECTED
Benzodiazepines: POSITIVE — AB
Cocaine: NOT DETECTED
Opiates: NOT DETECTED
TETRAHYDROCANNABINOL: NOT DETECTED

## 2015-07-14 LAB — COMPREHENSIVE METABOLIC PANEL
ALK PHOS: 76 U/L (ref 38–126)
ALT: 19 U/L (ref 17–63)
ANION GAP: 7 (ref 5–15)
AST: 19 U/L (ref 15–41)
Albumin: 3.7 g/dL (ref 3.5–5.0)
BUN: 23 mg/dL — ABNORMAL HIGH (ref 6–20)
CALCIUM: 8.9 mg/dL (ref 8.9–10.3)
CO2: 20 mmol/L — AB (ref 22–32)
Chloride: 109 mmol/L (ref 101–111)
Creatinine, Ser: 2.01 mg/dL — ABNORMAL HIGH (ref 0.61–1.24)
GFR calc non Af Amer: 32 mL/min — ABNORMAL LOW (ref >60)
GFR, EST AFRICAN AMERICAN: 37 mL/min — AB (ref >60)
Glucose, Bld: 120 mg/dL — ABNORMAL HIGH (ref 65–99)
POTASSIUM: 4.7 mmol/L (ref 3.5–5.1)
SODIUM: 136 mmol/L (ref 135–145)
Total Bilirubin: 0.5 mg/dL (ref 0.3–1.2)
Total Protein: 6.5 g/dL (ref 6.5–8.1)

## 2015-07-14 LAB — TROPONIN I

## 2015-07-14 LAB — PROTIME-INR
INR: 1.11 (ref 0.00–1.49)
PROTHROMBIN TIME: 14.5 s (ref 11.6–15.2)

## 2015-07-14 LAB — URINALYSIS, ROUTINE W REFLEX MICROSCOPIC
BILIRUBIN URINE: NEGATIVE
Glucose, UA: 100 mg/dL — AB
Hgb urine dipstick: NEGATIVE
KETONES UR: NEGATIVE mg/dL
LEUKOCYTES UA: NEGATIVE
NITRITE: NEGATIVE
PROTEIN: NEGATIVE mg/dL
Specific Gravity, Urine: 1.01 (ref 1.005–1.030)
pH: 6 (ref 5.0–8.0)

## 2015-07-14 LAB — APTT: APTT: 31 s (ref 24–37)

## 2015-07-14 LAB — ETHANOL: Alcohol, Ethyl (B): <5 mg/dL (ref <5)

## 2015-07-14 MED ORDER — SODIUM CHLORIDE 0.9 % IV SOLN
100.0000 mL/h | INTRAVENOUS | Status: DC
  Administered 2015-07-14: 100 mL/h via INTRAVENOUS

## 2015-07-14 MED ORDER — SODIUM CHLORIDE 0.9 % IV BOLUS (SEPSIS)
500.0000 mL | Freq: Once | INTRAVENOUS | Status: AC
  Administered 2015-07-14: 500 mL via INTRAVENOUS

## 2015-07-14 MED ORDER — SODIUM CHLORIDE 0.9 % IV BOLUS (SEPSIS)
1000.0000 mL | Freq: Once | INTRAVENOUS | Status: AC
  Administered 2015-07-14: 1000 mL via INTRAVENOUS

## 2015-07-14 NOTE — ED Notes (Signed)
Pt arrives EMS from MD office where he was seen for generalized weakness. Episode started at 11 am today. Hx of similar episode over last 9 years. States he is unable to walk without staggering.

## 2015-07-14 NOTE — ED Notes (Signed)
MD at bedside. 

## 2015-07-14 NOTE — Discharge Instructions (Signed)
As discussed, your evaluation today has been largely reassuring.  But, it is important that you monitor your condition carefully, and do not hesitate to return to the ED if you develop new, or concerning changes in your condition.  Otherwise, please follow-up with your physician for appropriate ongoing care.  Weakness Weakness is a lack of strength. It may be felt all over the body (generalized) or in one specific part of the body (focal). Some causes of weakness can be serious. You may need further medical evaluation, especially if you are elderly or you have a history of immunosuppression (such as chemotherapy or HIV), kidney disease, heart disease, or diabetes. CAUSES  Weakness can be caused by many different things, including:  Infection.  Physical exhaustion.  Internal bleeding or other blood loss that results in a lack of red blood cells (anemia).  Dehydration. This cause is more common in elderly people.  Side effects or electrolyte abnormalities from medicines, such as pain medicines or sedatives.  Emotional distress, anxiety, or depression.  Circulation problems, especially severe peripheral arterial disease.  Heart disease, such as rapid atrial fibrillation, bradycardia, or heart failure.  Nervous system disorders, such as Guillain-Barr syndrome, multiple sclerosis, or stroke. DIAGNOSIS  To find the cause of your weakness, your caregiver will take your history and perform a physical exam. Lab tests or X-rays may also be ordered, if needed. TREATMENT  Treatment of weakness depends on the cause of your symptoms and can vary greatly. HOME CARE INSTRUCTIONS   Rest as needed.  Eat a well-balanced diet.  Try to get some exercise every day.  Only take over-the-counter or prescription medicines as directed by your caregiver. SEEK MEDICAL CARE IF:   Your weakness seems to be getting worse or spreads to other parts of your body.  You develop new aches or pains. SEEK  IMMEDIATE MEDICAL CARE IF:   You cannot perform your normal daily activities, such as getting dressed and feeding yourself.  You cannot walk up and down stairs, or you feel exhausted when you do so.  You have shortness of breath or chest pain.  You have difficulty moving parts of your body.  You have weakness in only one area of the body or on only one side of the body.  You have a fever.  You have trouble speaking or swallowing.  You cannot control your bladder or bowel movements.  You have black or bloody vomit or stools. MAKE SURE YOU:  Understand these instructions.  Will watch your condition.  Will get help right away if you are not doing well or get worse.   This information is not intended to replace advice given to you by your health care provider. Make sure you discuss any questions you have with your health care provider.   Document Released: 01/29/2005 Document Revised: 07/31/2011 Document Reviewed: 03/30/2011 Elsevier Interactive Patient Education [employer].

## 2015-07-14 NOTE — ED Notes (Signed)
Pt stated he was unable to urinate

## 2015-07-14 NOTE — ED Notes (Signed)
Urinal left at bedside. 

## 2015-07-14 NOTE — ED Notes (Signed)
Patient transported to CT 

## 2015-07-14 NOTE — ED Provider Notes (Signed)
CSN: [id number]     Arrival date & time 07/14/15  1759 History   First MD Initiated Contact with Patient 07/14/15 1801     Chief Complaint  Patient presents with  . Weakness    HPI Patient presents with concern of ongoing episodes of weakness. Patient notes that the episodes have occurred for years, but over the past 3 weeks he has had increasing frequency and duration of his episodes.  Each episode occurs without clear precipitant, lasts up to several hours. Patient typically has some relief by taking Valium, resting. Episodes consist of headache, blurry vision, weakness in all 4 extremities, gait instability. Patient has seen outpatient neurology once, without clear diagnosis. Currently he is essentially recovered from his most recent episode, has only mild generalized weakness as a complaint.   Past Medical History  Diagnosis Date  . COPD (chronic obstructive pulmonary disease) ([place])   . Diabetes mellitus without complication ([place])   . Hypertension   . Renal disorder   . Bipolar 1 disorder ([place])   . Prostate atrophy   . Kidney stones    Past Surgical History  Procedure Laterality Date  . Wrist surgery    . Nasal sinus surgery    . Appendectomy     No family history on file. Social History  Substance Use Topics  . Smoking status: Former Research scientist (life sciences)  . Smokeless tobacco: None  . Alcohol Use: No    Review of Systems  Constitutional:       Per HPI, otherwise negative  HENT:       Per HPI, otherwise negative  Respiratory:       Per HPI, otherwise negative  Cardiovascular:       Per HPI, otherwise negative  Gastrointestinal: Negative for vomiting.  Endocrine:       Negative aside from HPI  Genitourinary:       Inconsistent urinary pattern  Musculoskeletal:       Per HPI, otherwise negative  Skin: Negative.   Allergic/Immunologic: Negative for immunocompromised state.  Neurological: Positive for weakness. Negative for syncope.      Allergies   Penicillins  Home Medications   Prior to Admission medications   Not on File   BP 147/107 mmHg  Pulse 90  Temp(Src) 97.9 F (36.6 C) (Oral)  Resp 22  Ht 5\' 8"  (1.727 m)  Wt 187 lb (84.823 kg)  BMI 28.44 kg/m2  SpO2 97% Physical Exam  Constitutional: He is oriented to person, place, and time. He appears well-developed. No distress.  HENT:  Head: Normocephalic and atraumatic.  Eyes: Conjunctivae and EOM are normal.  Cardiovascular: Normal rate and regular rhythm.   Pulmonary/Chest: Effort normal. No stridor. No respiratory distress.  Abdominal: He exhibits no distension.  Musculoskeletal: He exhibits no edema.  Neurological: He is alert and oriented to person, place, and time. He displays tremor. He displays no atrophy. No cranial nerve deficit or sensory deficit. He exhibits normal muscle tone. He displays no seizure activity.  Tremulous Patient not ambulated 2/2 instability  Skin: Skin is warm and dry.  Psychiatric: He has a normal mood and affect.  Nursing note and vitals reviewed.   ED Course  Procedures (including critical care time) Labs Review Labs Reviewed  COMPREHENSIVE METABOLIC PANEL - Abnormal; Notable for the following:    CO2 20 (*)    Glucose, Bld 120 (*)    BUN 23 (*)    Creatinine, Ser 2.01 (*)    GFR calc non Af Amer 32 (*)  GFR calc Af Amer 37 (*)    All other components within normal limits  CBC WITH DIFFERENTIAL/PLATELET - Abnormal; Notable for the following:    Hemoglobin 11.3 (*)    HCT 37.3 (*)    MCV 77.7 (*)    MCH 23.5 (*)    RDW 16.8 (*)    All other components within normal limits  APTT  ETHANOL  TROPONIN I  PROTIME-INR  URINE RAPID DRUG SCREEN, HOSP PERFORMED  URINALYSIS, ROUTINE W REFLEX MICROSCOPIC (NOT AT [facility])    Imaging Review Ct Head Wo Contrast  07/14/2015  CLINICAL DATA:  Recurrent episodes of bilateral hands and legs weakness ongoing for the past 6 months. EXAM: CT HEAD WITHOUT CONTRAST TECHNIQUE: Contiguous axial  images were obtained from the base of the skull through the vertex without intravenous contrast. COMPARISON:  None. FINDINGS: Brain: No evidence of acute infarction, hemorrhage, extra-axial collection, ventriculomegaly, or mass effect. There is mild atrophy and chronic small vessel disease changes. Small bilateral low corn infarcts are seen in the basal ganglia. Vascular: No hyperdense vessel or unexpected calcification. Skull: Negative for fracture or focal lesion. Sinuses/Orbits: No acute findings. Other: None. IMPRESSION: No acute intracranial abnormality. Mild brain parenchymal atrophy, chronic microvascular disease. 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 Electronically Signed   By: Fidela Salisbury M.D.   On: 07/14/2015 20:08   I have personally reviewed and evaluated these images and lab results as part of my medical decision-making.   EKG Interpretation   Date/Time:  Thursday July 14 2015 19:33:02 EDT Ventricular Rate:  76 PR Interval:  137 QRS Duration: 95 QT Interval:  403 QTC Calculation: 453 R Axis:   42 Text Interpretation:  Sinus rhythm Artifact Borderline ECG Confirmed by  Carmin Muskrat  MD ([id number]) on 07/14/2015 9:28:43 PM     9:47 PM I discussed patient's case with our neurology team. No indication for emergent MRI, nor clear direction for therapy. Patient is appropriate for outpatient neurology follow-up, with likely EEG, MRI.  Patient in no distress, aware of all findings.  MDM  Patient presents with concern for episodic periods of weakness, headache, gait instability. Here the patient is awake and alert, essentially recovered from his most recent episode per Patient's neurologic exam is reassuring, and though the head CT demonstrates bibasilar ganglia infarcts, there is no evidence for acute stroke, consistent with his neurologic exam. Symptoms maybe secondary to partial seizure versus complex migraine versus other phenomenon. Patient has stable  vital signs, was discharged in stable condition to follow-up as an outpatient.  Carmin Muskrat, MD 07/14/15 2250

## 2015-07-18 LAB — CBG MONITORING, ED: GLUCOSE-CAPILLARY: 166 mg/dL — AB (ref 65–99)

## 2015-08-02 DIAGNOSIS — H353131 Nonexudative age-related macular degeneration, bilateral, early dry stage: Secondary | ICD-10-CM | POA: Diagnosis not present

## 2015-08-02 DIAGNOSIS — H25812 Combined forms of age-related cataract, left eye: Secondary | ICD-10-CM | POA: Diagnosis not present

## 2015-08-11 DIAGNOSIS — F410 Panic disorder [episodic paroxysmal anxiety] without agoraphobia: Secondary | ICD-10-CM | POA: Diagnosis not present

## 2015-08-11 DIAGNOSIS — F319 Bipolar disorder, unspecified: Secondary | ICD-10-CM | POA: Diagnosis not present

## 2015-08-11 DIAGNOSIS — G459 Transient cerebral ischemic attack, unspecified: Secondary | ICD-10-CM | POA: Diagnosis not present

## 2015-08-11 DIAGNOSIS — E785 Hyperlipidemia, unspecified: Secondary | ICD-10-CM | POA: Diagnosis not present

## 2015-08-11 DIAGNOSIS — R2689 Other abnormalities of gait and mobility: Secondary | ICD-10-CM | POA: Diagnosis not present

## 2015-08-11 DIAGNOSIS — I10 Essential (primary) hypertension: Secondary | ICD-10-CM | POA: Diagnosis not present

## 2015-08-11 DIAGNOSIS — J449 Chronic obstructive pulmonary disease, unspecified: Secondary | ICD-10-CM | POA: Diagnosis not present

## 2015-08-11 DIAGNOSIS — Z905 Acquired absence of kidney: Secondary | ICD-10-CM | POA: Diagnosis not present

## 2015-09-08 DIAGNOSIS — G459 Transient cerebral ischemic attack, unspecified: Secondary | ICD-10-CM | POA: Diagnosis not present

## 2015-09-20 DIAGNOSIS — E785 Hyperlipidemia, unspecified: Secondary | ICD-10-CM | POA: Diagnosis not present

## 2015-09-20 DIAGNOSIS — I739 Peripheral vascular disease, unspecified: Secondary | ICD-10-CM | POA: Diagnosis not present

## 2015-09-20 DIAGNOSIS — I10 Essential (primary) hypertension: Secondary | ICD-10-CM | POA: Diagnosis not present

## 2015-09-20 DIAGNOSIS — N400 Enlarged prostate without lower urinary tract symptoms: Secondary | ICD-10-CM | POA: Diagnosis not present

## 2015-09-20 DIAGNOSIS — Z87891 Personal history of nicotine dependence: Secondary | ICD-10-CM | POA: Diagnosis not present

## 2015-09-20 DIAGNOSIS — M1990 Unspecified osteoarthritis, unspecified site: Secondary | ICD-10-CM | POA: Diagnosis not present

## 2015-09-20 DIAGNOSIS — J449 Chronic obstructive pulmonary disease, unspecified: Secondary | ICD-10-CM | POA: Diagnosis not present

## 2015-09-20 DIAGNOSIS — Z8673 Personal history of transient ischemic attack (TIA), and cerebral infarction without residual deficits: Secondary | ICD-10-CM | POA: Diagnosis not present

## 2015-09-20 DIAGNOSIS — Z79899 Other long term (current) drug therapy: Secondary | ICD-10-CM | POA: Diagnosis not present

## 2015-09-20 DIAGNOSIS — H25812 Combined forms of age-related cataract, left eye: Secondary | ICD-10-CM | POA: Diagnosis not present

## 2015-09-20 DIAGNOSIS — K219 Gastro-esophageal reflux disease without esophagitis: Secondary | ICD-10-CM | POA: Diagnosis not present

## 2015-09-27 DIAGNOSIS — H40013 Open angle with borderline findings, low risk, bilateral: Secondary | ICD-10-CM | POA: Diagnosis not present

## 2015-10-11 DIAGNOSIS — M1990 Unspecified osteoarthritis, unspecified site: Secondary | ICD-10-CM | POA: Diagnosis not present

## 2015-10-11 DIAGNOSIS — Z79899 Other long term (current) drug therapy: Secondary | ICD-10-CM | POA: Diagnosis not present

## 2015-10-11 DIAGNOSIS — E785 Hyperlipidemia, unspecified: Secondary | ICD-10-CM | POA: Diagnosis not present

## 2015-10-11 DIAGNOSIS — K219 Gastro-esophageal reflux disease without esophagitis: Secondary | ICD-10-CM | POA: Diagnosis not present

## 2015-10-11 DIAGNOSIS — N400 Enlarged prostate without lower urinary tract symptoms: Secondary | ICD-10-CM | POA: Diagnosis not present

## 2015-10-11 DIAGNOSIS — I10 Essential (primary) hypertension: Secondary | ICD-10-CM | POA: Diagnosis not present

## 2015-10-11 DIAGNOSIS — H25811 Combined forms of age-related cataract, right eye: Secondary | ICD-10-CM | POA: Diagnosis not present

## 2015-10-11 DIAGNOSIS — Z7901 Long term (current) use of anticoagulants: Secondary | ICD-10-CM | POA: Diagnosis not present

## 2015-10-11 DIAGNOSIS — J449 Chronic obstructive pulmonary disease, unspecified: Secondary | ICD-10-CM | POA: Diagnosis not present

## 2015-10-11 DIAGNOSIS — Z8673 Personal history of transient ischemic attack (TIA), and cerebral infarction without residual deficits: Secondary | ICD-10-CM | POA: Diagnosis not present

## 2015-10-11 DIAGNOSIS — Z87891 Personal history of nicotine dependence: Secondary | ICD-10-CM | POA: Diagnosis not present

## 2015-10-11 DIAGNOSIS — F419 Anxiety disorder, unspecified: Secondary | ICD-10-CM | POA: Diagnosis not present

## 2015-10-11 DIAGNOSIS — F319 Bipolar disorder, unspecified: Secondary | ICD-10-CM | POA: Diagnosis not present

## 2015-11-03 DIAGNOSIS — Z23 Encounter for immunization: Secondary | ICD-10-CM | POA: Diagnosis not present

## 2015-11-09 DIAGNOSIS — G459 Transient cerebral ischemic attack, unspecified: Secondary | ICD-10-CM | POA: Diagnosis not present

## 2015-11-09 DIAGNOSIS — F410 Panic disorder [episodic paroxysmal anxiety] without agoraphobia: Secondary | ICD-10-CM | POA: Diagnosis not present

## 2015-11-09 DIAGNOSIS — I10 Essential (primary) hypertension: Secondary | ICD-10-CM | POA: Diagnosis not present

## 2015-11-09 DIAGNOSIS — Z905 Acquired absence of kidney: Secondary | ICD-10-CM | POA: Diagnosis not present

## 2015-11-09 DIAGNOSIS — E785 Hyperlipidemia, unspecified: Secondary | ICD-10-CM | POA: Diagnosis not present

## 2015-11-09 DIAGNOSIS — Z6828 Body mass index (BMI) 28.0-28.9, adult: Secondary | ICD-10-CM | POA: Diagnosis not present

## 2015-11-09 DIAGNOSIS — F319 Bipolar disorder, unspecified: Secondary | ICD-10-CM | POA: Diagnosis not present

## 2015-11-09 DIAGNOSIS — J449 Chronic obstructive pulmonary disease, unspecified: Secondary | ICD-10-CM | POA: Diagnosis not present

## 2015-12-14 DIAGNOSIS — R1032 Left lower quadrant pain: Secondary | ICD-10-CM | POA: Diagnosis not present

## 2015-12-14 DIAGNOSIS — Z6828 Body mass index (BMI) 28.0-28.9, adult: Secondary | ICD-10-CM | POA: Diagnosis not present

## 2015-12-14 DIAGNOSIS — I714 Abdominal aortic aneurysm, without rupture: Secondary | ICD-10-CM | POA: Diagnosis not present

## 2015-12-16 DIAGNOSIS — I714 Abdominal aortic aneurysm, without rupture: Secondary | ICD-10-CM | POA: Diagnosis not present

## 2016-03-08 DIAGNOSIS — M159 Polyosteoarthritis, unspecified: Secondary | ICD-10-CM | POA: Diagnosis not present

## 2016-03-08 DIAGNOSIS — E785 Hyperlipidemia, unspecified: Secondary | ICD-10-CM | POA: Diagnosis not present

## 2016-03-08 DIAGNOSIS — G459 Transient cerebral ischemic attack, unspecified: Secondary | ICD-10-CM | POA: Diagnosis not present

## 2016-03-08 DIAGNOSIS — R2689 Other abnormalities of gait and mobility: Secondary | ICD-10-CM | POA: Diagnosis not present

## 2016-03-08 DIAGNOSIS — I10 Essential (primary) hypertension: Secondary | ICD-10-CM | POA: Diagnosis not present

## 2016-03-08 DIAGNOSIS — Z905 Acquired absence of kidney: Secondary | ICD-10-CM | POA: Diagnosis not present

## 2016-03-08 DIAGNOSIS — F410 Panic disorder [episodic paroxysmal anxiety] without agoraphobia: Secondary | ICD-10-CM | POA: Diagnosis not present

## 2016-03-08 DIAGNOSIS — F319 Bipolar disorder, unspecified: Secondary | ICD-10-CM | POA: Diagnosis not present

## 2016-03-08 DIAGNOSIS — J449 Chronic obstructive pulmonary disease, unspecified: Secondary | ICD-10-CM | POA: Diagnosis not present

## 2016-07-06 DIAGNOSIS — Z9181 History of falling: Secondary | ICD-10-CM | POA: Diagnosis not present

## 2016-07-06 DIAGNOSIS — Z0001 Encounter for general adult medical examination with abnormal findings: Secondary | ICD-10-CM | POA: Diagnosis not present

## 2016-07-06 DIAGNOSIS — Z125 Encounter for screening for malignant neoplasm of prostate: Secondary | ICD-10-CM | POA: Diagnosis not present

## 2016-07-06 DIAGNOSIS — E785 Hyperlipidemia, unspecified: Secondary | ICD-10-CM | POA: Diagnosis not present

## 2016-07-06 DIAGNOSIS — N400 Enlarged prostate without lower urinary tract symptoms: Secondary | ICD-10-CM | POA: Diagnosis not present

## 2016-07-06 DIAGNOSIS — I10 Essential (primary) hypertension: Secondary | ICD-10-CM | POA: Diagnosis not present

## 2016-08-11 DIAGNOSIS — M546 Pain in thoracic spine: Secondary | ICD-10-CM | POA: Diagnosis not present

## 2016-08-11 DIAGNOSIS — Z6827 Body mass index (BMI) 27.0-27.9, adult: Secondary | ICD-10-CM | POA: Diagnosis not present

## 2016-10-19 DIAGNOSIS — H26493 Other secondary cataract, bilateral: Secondary | ICD-10-CM | POA: Diagnosis not present

## 2016-10-24 DIAGNOSIS — Z23 Encounter for immunization: Secondary | ICD-10-CM | POA: Diagnosis not present

## 2016-11-05 DIAGNOSIS — R2689 Other abnormalities of gait and mobility: Secondary | ICD-10-CM | POA: Diagnosis not present

## 2016-11-05 DIAGNOSIS — Z6828 Body mass index (BMI) 28.0-28.9, adult: Secondary | ICD-10-CM | POA: Diagnosis not present

## 2016-11-05 DIAGNOSIS — Z905 Acquired absence of kidney: Secondary | ICD-10-CM | POA: Diagnosis not present

## 2016-11-05 DIAGNOSIS — I10 Essential (primary) hypertension: Secondary | ICD-10-CM | POA: Diagnosis not present

## 2016-11-05 DIAGNOSIS — M159 Polyosteoarthritis, unspecified: Secondary | ICD-10-CM | POA: Diagnosis not present

## 2016-11-05 DIAGNOSIS — J449 Chronic obstructive pulmonary disease, unspecified: Secondary | ICD-10-CM | POA: Diagnosis not present

## 2016-11-05 DIAGNOSIS — F410 Panic disorder [episodic paroxysmal anxiety] without agoraphobia: Secondary | ICD-10-CM | POA: Diagnosis not present

## 2016-11-05 DIAGNOSIS — G459 Transient cerebral ischemic attack, unspecified: Secondary | ICD-10-CM | POA: Diagnosis not present

## 2016-11-05 DIAGNOSIS — F319 Bipolar disorder, unspecified: Secondary | ICD-10-CM | POA: Diagnosis not present

## 2016-11-05 DIAGNOSIS — E785 Hyperlipidemia, unspecified: Secondary | ICD-10-CM | POA: Diagnosis not present

## 2017-06-16 IMAGING — CT CT HEAD W/O CM
4 series · 17 of 47 positions shown, 19 images · non-contrast
Comparison: None.

CLINICAL DATA: Recurrent episodes of bilateral hands and legs
weakness ongoing for the past 6 months.

EXAM:
CT HEAD WITHOUT CONTRAST
TECHNIQUE: Contiguous axial images were obtained from the base of the skull
through the vertex without intravenous contrast.

[Series 2: head without · axial · non-contrast · 0.43mm/px · z∈[-129,-4]mm · 7 of 35 slices shown, 9 images]
[im 5/35  brain]
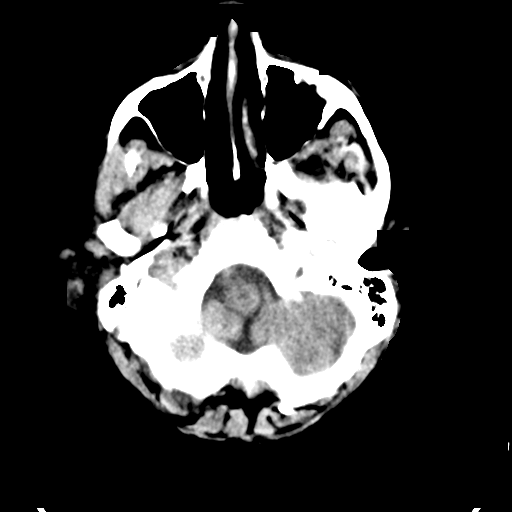
[im 5/35  bone]
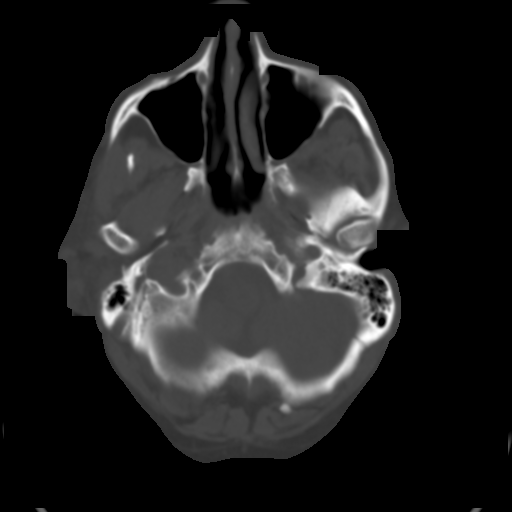
[im 9/35  brain]
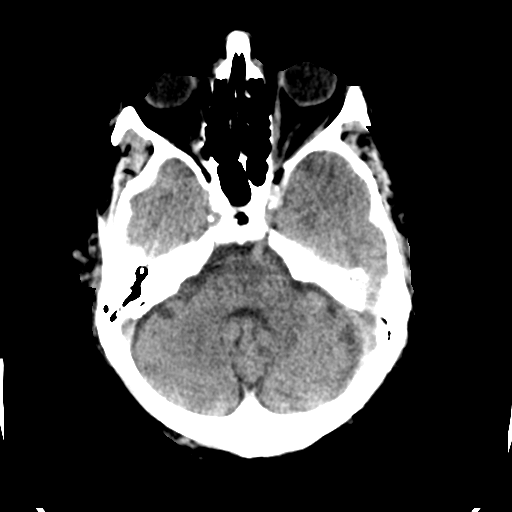
[im 13/35  brain]
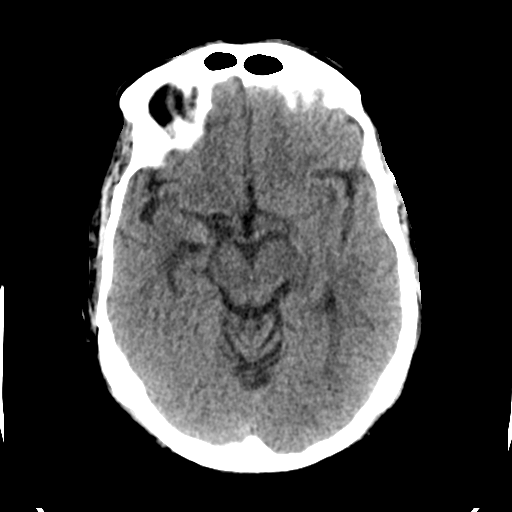
[im 18/35  brain]
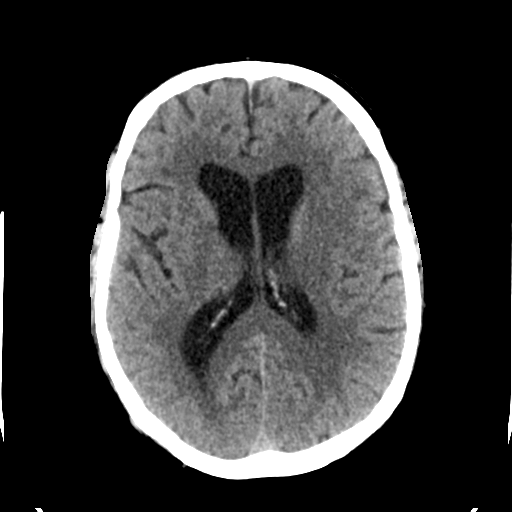
[im 22/35  brain]
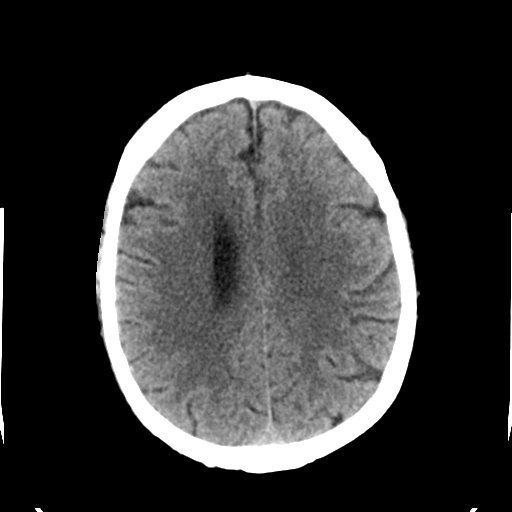
[im 22/35  bone]
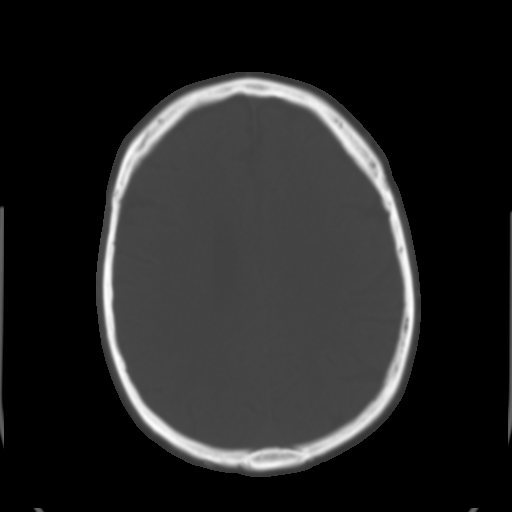
[im 26/35  brain]
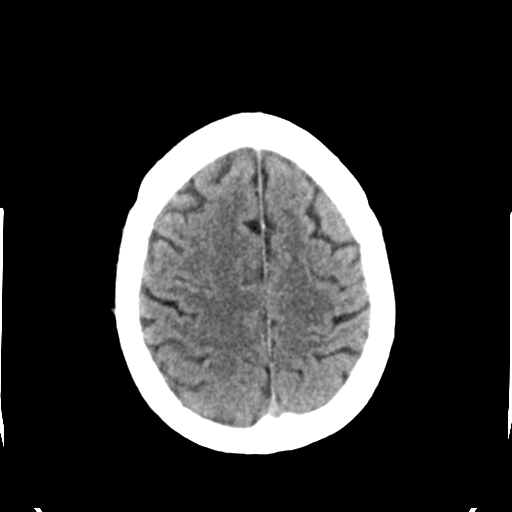
[im 30/35  brain]
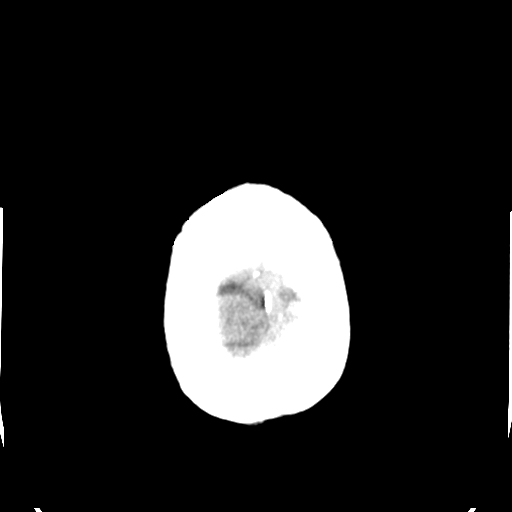

[Series 3: head bone · axial · 0.43mm/px · z∈[-133,-73]mm · 4 of 86 slices shown]
[im 9/86  bone]
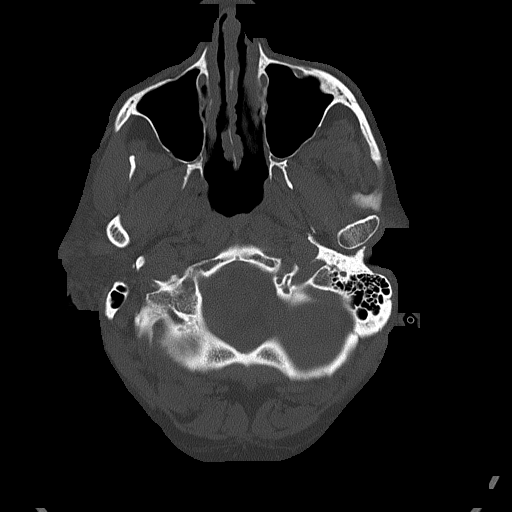
[im 18/86  bone]
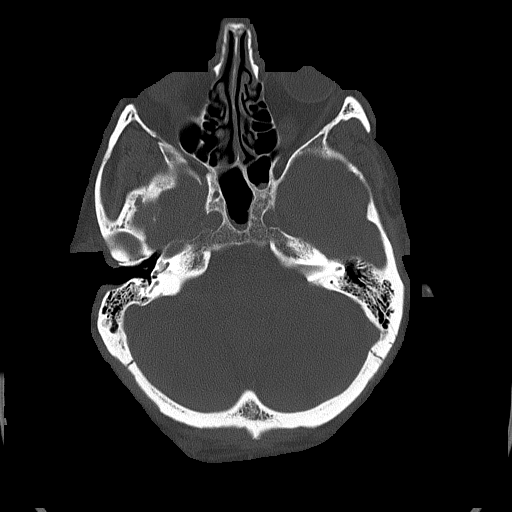
[im 26/86  bone]
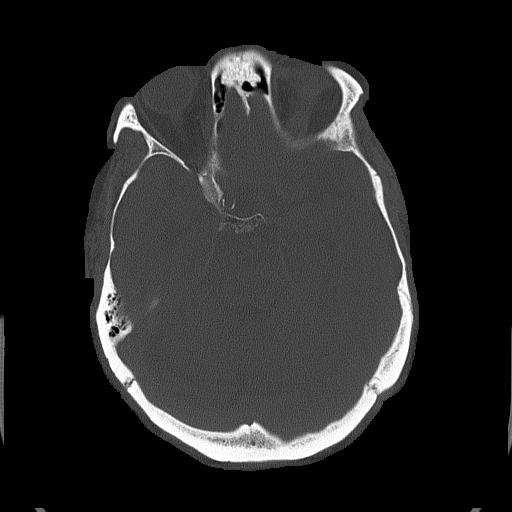
[im 39/86  bone]
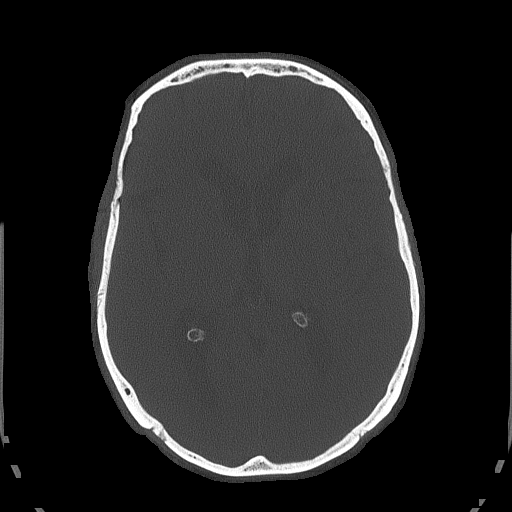

[Series 4: head without cor · coronal · non-contrast · 0.33mm/px · 3 of 63 slices shown]
[im 21/63  brain]
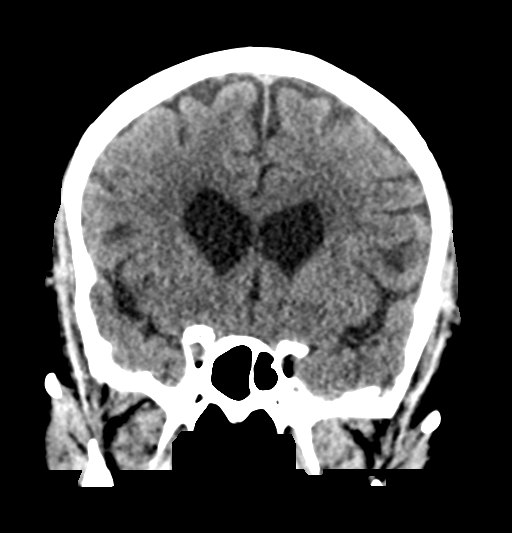
[im 28/63  brain]
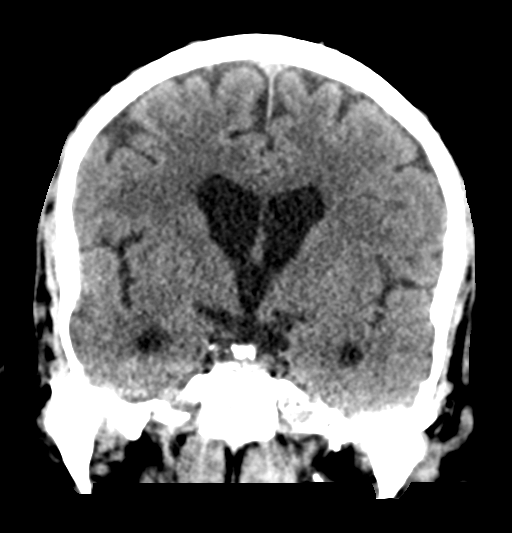
[im 35/63  brain]
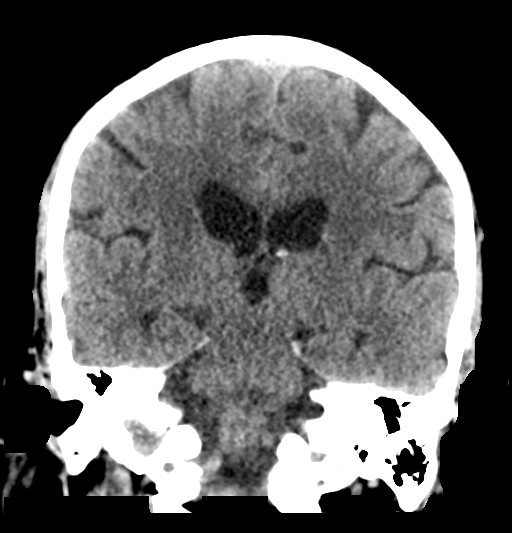

[Series 5: head without sag · sagittal · non-contrast · 0.33mm/px · 3 of 56 slices shown]
[im 19/56  brain]
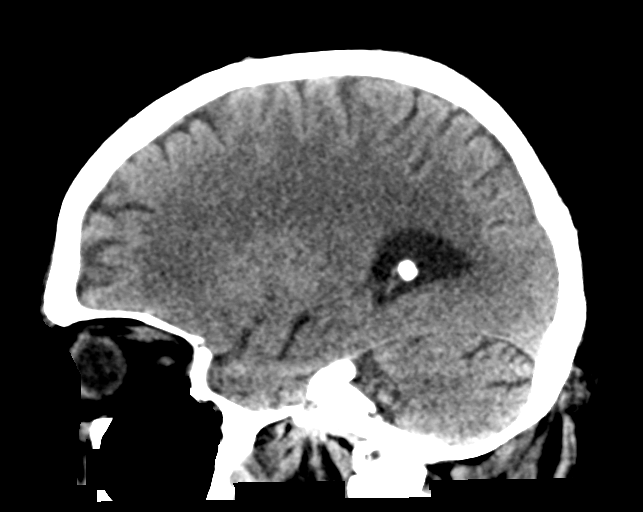
[im 28/56  brain]
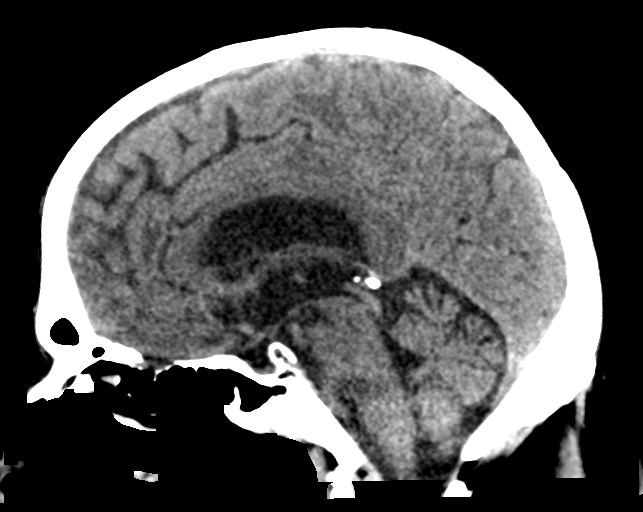
[im 37/56  brain]
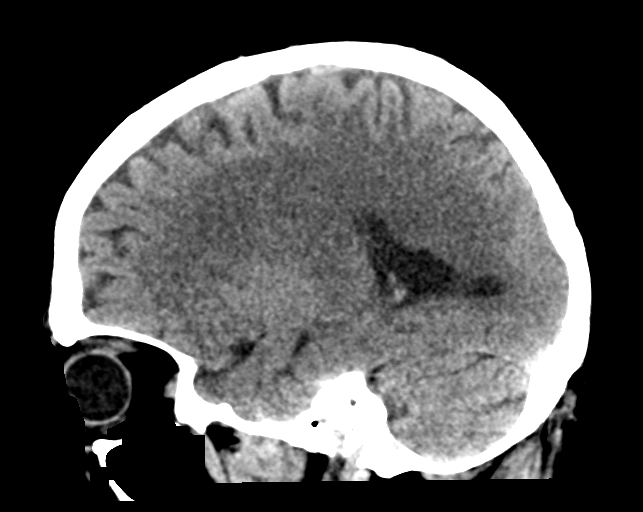

[17 of 47 positions shown; findings below may reference images not displayed]

FINDINGS: Brain: No evidence of acute infarction, hemorrhage, extra-axial
collection, ventriculomegaly, or mass effect. There is mild atrophy
and chronic small vessel disease changes. Small bilateral low corn
infarcts are seen in the basal ganglia.

Vascular: No hyperdense vessel or unexpected calcification.

Skull: Negative for fracture or focal lesion.

Sinuses/Orbits: No acute findings.

Other: None.
IMPRESSION: No acute intracranial abnormality.

Mild brain parenchymal atrophy, chronic microvascular disease. Small
bilateral lacunar infarcts of the basal ganglia.

## 2018-02-17 ENCOUNTER — Other Ambulatory Visit: Payer: Self-pay | Admitting: Surgery

## 2018-02-18 ENCOUNTER — Other Ambulatory Visit: Payer: Self-pay | Admitting: Surgery

## 2018-02-18 DIAGNOSIS — C20 Malignant neoplasm of rectum: Secondary | ICD-10-CM

## 2018-02-21 DIAGNOSIS — Z809 Family history of malignant neoplasm, unspecified: Secondary | ICD-10-CM | POA: Diagnosis not present

## 2018-02-21 DIAGNOSIS — Z800 Family history of malignant neoplasm of digestive organs: Secondary | ICD-10-CM

## 2018-02-21 DIAGNOSIS — Z87891 Personal history of nicotine dependence: Secondary | ICD-10-CM | POA: Diagnosis not present

## 2018-02-21 DIAGNOSIS — C187 Malignant neoplasm of sigmoid colon: Secondary | ICD-10-CM | POA: Diagnosis not present

## 2018-03-04 ENCOUNTER — Ambulatory Visit
Admission: RE | Admit: 2018-03-04 | Discharge: 2018-03-04 | Disposition: A | Discharge status: Home / Self Care | Payer: Medicare Other | Source: Ambulatory Visit | Attending: Surgery | Admitting: Surgery

## 2018-03-04 DIAGNOSIS — C20 Malignant neoplasm of rectum: Secondary | ICD-10-CM

## 2018-03-04 MED ORDER — GADOBENATE DIMEGLUMINE 529 MG/ML IV SOLN
15.0000 mL | Freq: Once | INTRAVENOUS | Status: AC | PRN
  Administered 2018-03-04: 15 mL via INTRAVENOUS

## 2018-03-05 DIAGNOSIS — C20 Malignant neoplasm of rectum: Secondary | ICD-10-CM | POA: Diagnosis not present

## 2018-04-08 DIAGNOSIS — C20 Malignant neoplasm of rectum: Secondary | ICD-10-CM

## 2018-04-22 DIAGNOSIS — C20 Malignant neoplasm of rectum: Secondary | ICD-10-CM | POA: Diagnosis not present

## 2018-06-23 DIAGNOSIS — Z01818 Encounter for other preprocedural examination: Secondary | ICD-10-CM | POA: Diagnosis not present

## 2018-08-11 DIAGNOSIS — C20 Malignant neoplasm of rectum: Secondary | ICD-10-CM | POA: Diagnosis not present

## 2018-08-25 DIAGNOSIS — C19 Malignant neoplasm of rectosigmoid junction: Secondary | ICD-10-CM | POA: Diagnosis not present

## 2018-08-25 DIAGNOSIS — C20 Malignant neoplasm of rectum: Secondary | ICD-10-CM | POA: Diagnosis not present

## 2018-09-08 DIAGNOSIS — C20 Malignant neoplasm of rectum: Secondary | ICD-10-CM | POA: Diagnosis not present

## 2018-09-08 DIAGNOSIS — C19 Malignant neoplasm of rectosigmoid junction: Secondary | ICD-10-CM | POA: Diagnosis not present

## 2018-09-22 DIAGNOSIS — C20 Malignant neoplasm of rectum: Secondary | ICD-10-CM | POA: Diagnosis not present

## 2018-09-22 DIAGNOSIS — C19 Malignant neoplasm of rectosigmoid junction: Secondary | ICD-10-CM | POA: Diagnosis not present

## 2018-11-04 DIAGNOSIS — C20 Malignant neoplasm of rectum: Secondary | ICD-10-CM

## 2018-11-04 DIAGNOSIS — C19 Malignant neoplasm of rectosigmoid junction: Secondary | ICD-10-CM

## 2019-03-06 DIAGNOSIS — C20 Malignant neoplasm of rectum: Secondary | ICD-10-CM

## 2019-11-23 DIAGNOSIS — C20 Malignant neoplasm of rectum: Secondary | ICD-10-CM | POA: Diagnosis not present

## 2020-03-02 ENCOUNTER — Telehealth: Payer: Self-pay | Admitting: Oncology

## 2020-03-02 NOTE — Telephone Encounter (Signed)
1/19 Spoke with patient and rescheduled all appts

## 2020-03-25 ENCOUNTER — Ambulatory Visit: Payer: Medicare Other | Admitting: Oncology

## 2020-04-04 ENCOUNTER — Ambulatory Visit: Payer: Medicare Other | Admitting: Oncology

## 2020-04-20 ENCOUNTER — Encounter: Payer: Self-pay | Admitting: Oncology

## 2020-04-21 ENCOUNTER — Encounter: Payer: Self-pay | Admitting: Oncology

## 2020-04-24 NOTE — Progress Notes (Unsigned)
[facility]  [address] [place],  [place]  [address] [phone]  Clinic Day:  04/25/2020  Referring physician: Charlynn Court, NP   HISTORY OF PRESENT ILLNESS:  The patient is a 75 y.o. 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  He received neoadjuvant chemoradiation, followed by a laparoscopic low anterior resection in May 2020.  He underwent 3 cycles of adjuvant FOLFOX chemotherapy before the decision was made to discontinue it due to a progressive failure to thrive.  Since then, labs and scans have shown no evidence of disease recurrence.  He comes in today for routine follow up, as well as to review his most recent CT scans.  Since his last visit, the patient has been doing well.  Other than occasional nausea at night, he denies having any new GI symptoms which concern him for possible disease recurrence.   PHYSICAL EXAM:  Blood pressure (!) 144/78, pulse 77, temperature 98.4 F (36.9 C), resp. rate 16, height 5\' 6"  (1.676 m), weight 167 lb 11.2 oz (76.1 kg), SpO2 95 %. Wt Readings from Last 3 Encounters:  04/25/20 167 lb 11.2 oz (76.1 kg)  07/14/15 187 lb (84.8 kg)   Body mass index is 27.07 kg/m. Performance status (ECOG): 1 - Symptomatic but completely ambulatory Physical Exam Constitutional:      Appearance: Normal appearance. He is not ill-appearing.  HENT:     Mouth/Throat:     Mouth: Mucous membranes are moist.     Pharynx: Oropharynx is clear. No oropharyngeal exudate or posterior oropharyngeal erythema.  Cardiovascular:     Rate and Rhythm: Normal rate and regular rhythm.     Heart sounds: No murmur heard. No friction rub. No gallop.   Pulmonary:     Effort: Pulmonary effort is normal. No respiratory distress.     Breath sounds: Normal breath sounds. No wheezing, rhonchi or rales.  Chest:  Breasts:     Right: No axillary adenopathy or supraclavicular adenopathy.     Left: No axillary adenopathy or supraclavicular  adenopathy.    Abdominal:     General: Bowel sounds are normal. There is no distension.     Palpations: Abdomen is soft. There is no mass.     Tenderness: There is no abdominal tenderness.  Musculoskeletal:        General: No swelling.     Right lower leg: No edema.     Left lower leg: No edema.  Lymphadenopathy:     Cervical: No cervical adenopathy.     Upper Body:     Right upper body: No supraclavicular or axillary adenopathy.     Left upper body: No supraclavicular or axillary adenopathy.     Lower Body: No right inguinal adenopathy. No left inguinal adenopathy.  Skin:    General: Skin is warm.     Coloration: Skin is not jaundiced.     Findings: No lesion or rash.  Neurological:     General: No focal deficit present.     Mental Status: He is alert and oriented to person, place, and time. Mental status is at baseline.     Cranial Nerves: Cranial nerves are intact.  Psychiatric:        Mood and Affect: Mood normal.        Behavior: Behavior normal.        Thought Content: Thought content normal.     LABS:     STUDIES:  CT scans of his abdomen/pelvis revealed  the following: FINDINGS: Lower chest: Unremarkable.  Hepatobiliary: No suspicious focal abnormality within the liver parenchyma. There is no evidence for gallstones, gallbladder wall thickening, or pericholecystic fluid. No intrahepatic or extrahepatic biliary dilation.  Pancreas: No focal mass lesion. No dilatation of the main duct. No intraparenchymal cyst. No peripancreatic edema.  Spleen: No splenomegaly. No focal mass lesion.  Adrenals/Urinary Tract: No adrenal nodule or mass. Atrophic right kidney with stable appearance of small cyst. 2 mm nonobstructing stone noted upper pole left kidney with 5 mm nonobstructing stone in the lower pole. Scattered tiny cortical hypodensities in the left kidney are too small to characterize but appear similar to prior and are likely benign. No evidence for  hydroureter. Small right bladder diverticulum again noted. Tiny left bladder diverticulum seen previously not readily evident today.  Stomach/Bowel: Stomach is unremarkable. No gastric wall thickening. No evidence of outlet obstruction. Duodenum is normally positioned as is the ligament of Treitz. No small bowel wall thickening. No small bowel dilatation. The terminal ileum is normal. The appendix is normal. No gross colonic mass. No colonic wall thickening.  Vascular/Lymphatic: Infrarenal abdominal aortic aneurysm noted measuring up to 4.3 cm diameter. This has increased from 3.9 cm diameter when measuring on the prior study at the same level and in the same dimension. There is no gastrohepatic or hepatoduodenal ligament lymphadenopathy. No retroperitoneal or mesenteric lymphadenopathy. No pelvic sidewall lymphadenopathy.  Reproductive: Calcification noted in the prostate parenchyma.  Other: No intraperitoneal free fluid.  Musculoskeletal: No worrisome lytic or sclerotic osseous abnormality.  IMPRESSION: 1. Stable exam. No new or progressive interval findings. 2. 4.3 cm infrarenal abdominal aortic aneurysm, increased from 3.9 cm diameter on the prior study. Recommend follow-up every 12 months and vascular consultation. This recommendation follows ACR consensus guidelines: White Paper of the ACR Incidental Findings Committee II on Vascular Findings. J Am Coll Radiol 2013; 10:789-794. 3. Nonobstructing left renal stones. 4. 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 5. Aortic Atherosclerosis (ICD10-I70.0). ASSESSMENT & PLAN:  Assessment/Plan:  A 75 y.o. male with stage IIIB rectal cancer, status post neoadjuvant chemoradiation, a low anterior resection, and 3 cycles of adjuvant FOLFOX.  Based upon his physical exam today, recent CEA level and CT scans, the patient remains disease-free.  Understandably, the patient was pleased with his scans and labs.   Clinically, he appears to be doing well.  As that is the case, I will begin spacing his visits out to every 6 months.  The patient understands all the plans discussed today and is in agreement with them.     Dequincy Macarthur Critchley, MD

## 2020-04-25 ENCOUNTER — Inpatient Hospital Stay: Payer: Medicare Other | Attending: Oncology | Admitting: Oncology

## 2020-04-25 ENCOUNTER — Other Ambulatory Visit: Payer: Self-pay | Admitting: Oncology

## 2020-04-25 ENCOUNTER — Other Ambulatory Visit: Payer: Self-pay

## 2020-04-25 ENCOUNTER — Telehealth: Payer: Self-pay | Admitting: Oncology

## 2020-04-25 DIAGNOSIS — C20 Malignant neoplasm of rectum: Secondary | ICD-10-CM | POA: Diagnosis not present

## 2020-04-25 DIAGNOSIS — C188 Malignant neoplasm of overlapping sites of colon: Secondary | ICD-10-CM

## 2020-04-25 MED ORDER — ONDANSETRON HCL 4 MG PO TABS: 4.0000 mg | ORAL_TABLET | Freq: Three times a day (TID) | ORAL | 1 refills | Status: DC | PRN

## 2020-04-25 NOTE — Telephone Encounter (Signed)
Per 04/25/20 los next appt scheduled and given to patient

## 2020-06-02 ENCOUNTER — Telehealth: Payer: Self-pay

## 2020-06-02 ENCOUNTER — Other Ambulatory Visit: Payer: Self-pay | Admitting: Oncology

## 2020-06-02 NOTE — Telephone Encounter (Signed)
Pt called to get recommendations about constipation. I told pt that our providers recommend senna tabs 1-2 tabs po BID. The goal is to keep you having a BM at least every other day. I also mentioned he should keep  Miralax tin the home to use if he needs it.   Pt reports he has sit on the toilet for an hour before, because he felt like he needed to use the bathroom,but it wouldn't come out. I told him he should not sit on the toilet for that long for a couple reasons. First, straining will increase the chance of hemorrhoids or make them worse. Second, sitting there that long will cause his legs to become numb, increasing his chance of falling when he does get up. Third, sitting that long & straining, may stimulate his vagus nerve, which could cause him to be light headed, become clammy, and feel like he may pass out.  Pt's brother is in background and I heard him say, "I told you that wasn't good. You cant sit there that long". Pt's brother is going to pick up the senna and Miralax for pt.  Pt & Pt's brother verbalized understanding. Pt's brother wrote down the medications and how to take them. I encouraged him to call me back if needed.

## 2020-10-19 NOTE — Progress Notes (Signed)
[facility]  [address] [place],  [place]  [address] [phone]  Clinic Day:  10/26/2020  Referring physician: Charlynn Court, NP  This document serves as a record of services personally performed by Marice Potter, MD. It was created on their behalf by Curry,Lauren E, a trained medical scribe. The creation of this record is based on the scribe's personal observations and the provider's statements to them.  HISTORY OF PRESENT ILLNESS:  The patient is a 75 y.o. 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  He received neoadjuvant chemoradiation, followed by a laparoscopic low anterior resection in May 2020.  He underwent 3 cycles of adjuvant FOLFOX chemotherapy before the decision was made to discontinue it due to a progressive failure to thrive.  Since then, labs, a postsurgical colonoscopy, and scans have shown no evidence of disease recurrence.  He comes in today for routine follow up.   Since his last visit, the patient has been doing well.  He denies having any new GI symptoms which concern him for possible disease recurrence.  PHYSICAL EXAM:  Blood pressure (!) 144/68, pulse 67, temperature 97.7 F (36.5 C), resp. rate 16, height 5\' 6"  (1.676 m), weight 166 lb (75.3 kg), SpO2 96 %. Wt Readings from Last 3 Encounters:  10/26/20 166 lb (75.3 kg)  04/25/20 167 lb 11.2 oz (76.1 kg)  07/14/15 187 lb (84.8 kg)   Body mass index is 26.79 kg/m. Performance status (ECOG): 1 - Symptomatic but completely ambulatory Physical Exam Constitutional:      Appearance: Normal appearance. He is not ill-appearing.  HENT:     Mouth/Throat:     Mouth: Mucous membranes are moist.     Pharynx: Oropharynx is clear. No oropharyngeal exudate or posterior oropharyngeal erythema.  Cardiovascular:     Rate and Rhythm: Normal rate and regular rhythm.     Heart sounds: No murmur heard.   No friction rub. No gallop.  Pulmonary:     Effort: Pulmonary effort is  normal. No respiratory distress.     Breath sounds: Normal breath sounds. No wheezing, rhonchi or rales.  Abdominal:     General: Bowel sounds are normal. There is no distension.     Palpations: Abdomen is soft. There is no mass.     Tenderness: There is no abdominal tenderness.  Musculoskeletal:        General: No swelling.     Right lower leg: No edema.     Left lower leg: No edema.  Lymphadenopathy:     Cervical: No cervical adenopathy.     Upper Body:     Right upper body: No supraclavicular or axillary adenopathy.     Left upper body: No supraclavicular or axillary adenopathy.     Lower Body: No right inguinal adenopathy. No left inguinal adenopathy.  Skin:    General: Skin is warm.     Coloration: Skin is not jaundiced.     Findings: No lesion or rash.  Neurological:     General: No focal deficit present.     Mental Status: He is alert and oriented to person, place, and time. Mental status is at baseline.     Cranial Nerves: Cranial nerves are intact.  Psychiatric:        Mood and Affect: Mood normal.        Behavior: Behavior normal.        Thought Content: Thought content normal.    LABS:   Component  Ref Range & Units 10/25/2020   CEA 0.0 - 4.7 ng/mL 1.3     ASSESSMENT & PLAN:  Assessment/Plan:  A 75 y.o. male with stage IIIB rectal cancer, status post neoadjuvant chemoradiation, a low anterior resection, and 3 cycles of adjuvant FOLFOX.  Based upon his physical exam today and recent CEA level, the patient remains disease-free. Clinically, he appears to be doing well.  As that is the case, I will see him back in 6 months for repeat clinical assessment.  The patient understands all the plans discussed today and is in agreement with them.     I, Rita Ohara, am acting as scribe for Marice Potter, MD    I have reviewed this report as typed by the medical scribe, and it is complete and accurate.  Dequincy Macarthur Critchley, MD

## 2020-10-25 ENCOUNTER — Inpatient Hospital Stay: Payer: Medicare Other | Attending: Oncology

## 2020-10-25 DIAGNOSIS — C20 Malignant neoplasm of rectum: Secondary | ICD-10-CM | POA: Insufficient documentation

## 2020-10-25 DIAGNOSIS — C188 Malignant neoplasm of overlapping sites of colon: Secondary | ICD-10-CM

## 2020-10-26 ENCOUNTER — Telehealth: Payer: Self-pay | Admitting: Oncology

## 2020-10-26 ENCOUNTER — Other Ambulatory Visit: Payer: Self-pay | Admitting: Oncology

## 2020-10-26 ENCOUNTER — Inpatient Hospital Stay (INDEPENDENT_AMBULATORY_CARE_PROVIDER_SITE_OTHER): Payer: Medicare Other | Admitting: Oncology

## 2020-10-26 ENCOUNTER — Other Ambulatory Visit: Payer: Self-pay

## 2020-10-26 VITALS — BP 144/68 | HR 67 | Temp 97.7°F | Resp 16 | Ht 66.0 in | Wt 166.0 lb

## 2020-10-26 DIAGNOSIS — C20 Malignant neoplasm of rectum: Secondary | ICD-10-CM

## 2020-10-26 LAB — CEA: CEA: 1.3 ng/mL (ref 0.0–4.7)

## 2020-10-26 NOTE — Telephone Encounter (Signed)
Per 9/14 LOS next appt scheduled and given to patient

## 2020-10-31 ENCOUNTER — Other Ambulatory Visit: Payer: Self-pay | Admitting: Hematology and Oncology

## 2020-10-31 MED ORDER — ONDANSETRON HCL 4 MG PO TABS: 4.0000 mg | ORAL_TABLET | Freq: Three times a day (TID) | ORAL | 1 refills | Status: DC | PRN

## 2020-12-06 ENCOUNTER — Other Ambulatory Visit: Payer: Self-pay

## 2020-12-06 ENCOUNTER — Telehealth: Payer: Self-pay

## 2020-12-06 NOTE — Telephone Encounter (Signed)
Pt came into clinic to speak with nurse about abdominal bloating/cramping, constipation, & intermittent nausea. He states he has only been having a small amount of stool, marble size. He is taking colace and has taken Dulcolax 3 tabs @ one time, to help him move. Pt reports that he feels the urge to poop, and will end up sitting on the toilet for a good while. I instructed pt that he shouldn't be sitting on the toilet for an hour @ a time. I gave pt the constipation recipe we recommend for pt's to use. Pt will start senna 2 tabs po BID. He can add Dulcolax or Miralax 2-3 times weekly as needed to maintain daily BM. I also recommended he try eating prunes or drinking prune juice.

## 2021-04-18 NOTE — Progress Notes (Incomplete)
[facility]  [address] [place],  [place]  [address] [phone]  Clinic Day:  04/18/2021  Referring physician: Charlynn Court, NP  This document serves as a record of services personally performed by Marice Potter, MD. It was created on their behalf by Curry,Lauren E, a trained medical scribe. The creation of this record is based on the scribe's personal observations and the provider's statements to them.  HISTORY OF PRESENT ILLNESS:  The patient is a 76 y.o. 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  He received neoadjuvant chemoradiation, followed by a laparoscopic low anterior resection in May 2020.  He underwent 3 cycles of adjuvant FOLFOX chemotherapy before the decision was made to discontinue it due to a progressive failure to thrive.  Since then, labs, a postsurgical colonoscopy, and scans have shown no evidence of disease recurrence.  He comes in today for routine follow up.   Since his last visit, the patient has been doing well.  He denies having any new GI symptoms which concern him for possible disease recurrence.  PHYSICAL EXAM:  There were no vitals taken for this visit. Wt Readings from Last 3 Encounters:  10/26/20 166 lb (75.3 kg)  04/25/20 167 lb 11.2 oz (76.1 kg)  07/14/15 187 lb (84.8 kg)   There is no height or weight on file to calculate BMI. Performance status (ECOG): 1 - Symptomatic but completely ambulatory Physical Exam Constitutional:      Appearance: Normal appearance. He is not ill-appearing.  HENT:     Mouth/Throat:     Mouth: Mucous membranes are moist.     Pharynx: Oropharynx is clear. No oropharyngeal exudate or posterior oropharyngeal erythema.  Cardiovascular:     Rate and Rhythm: Normal rate and regular rhythm.     Heart sounds: No murmur heard.   No friction rub. No gallop.  Pulmonary:     Effort: Pulmonary effort is normal. No respiratory distress.     Breath sounds: Normal breath sounds. No  wheezing, rhonchi or rales.  Abdominal:     General: Bowel sounds are normal. There is no distension.     Palpations: Abdomen is soft. There is no mass.     Tenderness: There is no abdominal tenderness.  Musculoskeletal:        General: No swelling.     Right lower leg: No edema.     Left lower leg: No edema.  Lymphadenopathy:     Cervical: No cervical adenopathy.     Upper Body:     Right upper body: No supraclavicular or axillary adenopathy.     Left upper body: No supraclavicular or axillary adenopathy.     Lower Body: No right inguinal adenopathy. No left inguinal adenopathy.  Skin:    General: Skin is warm.     Coloration: Skin is not jaundiced.     Findings: No lesion or rash.  Neurological:     General: No focal deficit present.     Mental Status: He is alert and oriented to person, place, and time. Mental status is at baseline.  Psychiatric:        Mood and Affect: Mood normal.        Behavior: Behavior normal.        Thought Content: Thought content normal.    LABS:   Component Ref Range & Units 10/25/2020   CEA 0.0 - 4.7 ng/mL 1.3     ASSESSMENT & PLAN:  Assessment/Plan:  A  76 y.o. male with stage IIIB rectal cancer, status post neoadjuvant chemoradiation, a low anterior resection, and 3 cycles of adjuvant FOLFOX.  Based upon his physical exam today and recent CEA level, the patient remains disease-free. Clinically, he appears to be doing well.  As that is the case, I will see him back in 6 months for repeat clinical assessment.  The patient understands all the plans discussed today and is in agreement with them.     I, Rita Ohara, am acting as scribe for Marice Potter, MD    I have reviewed this report as typed by the medical scribe, and it is complete and accurate.  Dequincy Macarthur Critchley, MD

## 2021-04-24 ENCOUNTER — Inpatient Hospital Stay: Payer: Medicare Other | Attending: Oncology

## 2021-04-24 ENCOUNTER — Other Ambulatory Visit: Payer: Self-pay

## 2021-04-24 DIAGNOSIS — Z85048 Personal history of other malignant neoplasm of rectum, rectosigmoid junction, and anus: Secondary | ICD-10-CM | POA: Diagnosis not present

## 2021-04-24 DIAGNOSIS — C20 Malignant neoplasm of rectum: Secondary | ICD-10-CM

## 2021-04-25 ENCOUNTER — Other Ambulatory Visit: Payer: Self-pay | Admitting: Oncology

## 2021-04-25 ENCOUNTER — Other Ambulatory Visit: Payer: Self-pay | Admitting: Hematology and Oncology

## 2021-04-25 ENCOUNTER — Inpatient Hospital Stay (INDEPENDENT_AMBULATORY_CARE_PROVIDER_SITE_OTHER): Payer: Medicare Other | Admitting: Oncology

## 2021-04-25 VITALS — BP 150/77 | HR 72 | Temp 97.8°F | Resp 16 | Ht 66.0 in | Wt 165.3 lb

## 2021-04-25 DIAGNOSIS — C20 Malignant neoplasm of rectum: Secondary | ICD-10-CM | POA: Diagnosis not present

## 2021-04-25 LAB — CEA: CEA: 1.3 ng/mL (ref 0.0–4.7)

## 2021-10-25 ENCOUNTER — Inpatient Hospital Stay: Payer: Medicare Other | Attending: Oncology

## 2021-10-25 DIAGNOSIS — Z08 Encounter for follow-up examination after completed treatment for malignant neoplasm: Secondary | ICD-10-CM | POA: Insufficient documentation

## 2021-10-25 DIAGNOSIS — Z923 Personal history of irradiation: Secondary | ICD-10-CM | POA: Diagnosis not present

## 2021-10-25 DIAGNOSIS — Z9221 Personal history of antineoplastic chemotherapy: Secondary | ICD-10-CM | POA: Insufficient documentation

## 2021-10-25 DIAGNOSIS — C20 Malignant neoplasm of rectum: Secondary | ICD-10-CM | POA: Diagnosis present

## 2021-10-25 NOTE — Progress Notes (Signed)
[facility]  [address] [place],  [place]  [address] [phone]  Clinic Day:  10/26/2021  Referring physician: Charlynn Court, NP  HISTORY OF PRESENT ILLNESS:  The patient is a 76 y.o. 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  He received neoadjuvant chemoradiation, followed by a laparoscopic low anterior resection in May 2020.  He underwent 3 cycles of adjuvant FOLFOX chemotherapy before the decision was made to discontinue it due to a progressive failure to thrive.  Since then, labs, a postsurgical colonoscopy, and scans have shown no evidence of disease recurrence.  He comes in today for routine follow up.   Since his last visit, the patient has been doing well.  He denies having any new GI symptoms which concern him for possible disease recurrence.  PHYSICAL EXAM:  Blood pressure (!) 144/74, pulse 78, temperature 98.3 F (36.8 C), resp. rate 16, height '5\' 6"'$  (1.676 m), SpO2 94 %. Wt Readings from Last 3 Encounters:  04/25/21 165 lb 4.8 oz (75 kg)  10/26/20 166 lb (75.3 kg)  04/25/20 167 lb 11.2 oz (76.1 kg)   Body mass index is 26.68 kg/m. Performance status (ECOG): 1 - Symptomatic but completely ambulatory Physical Exam Constitutional:      Appearance: Normal appearance. He is not ill-appearing.  HENT:     Mouth/Throat:     Mouth: Mucous membranes are moist.     Pharynx: Oropharynx is clear. No oropharyngeal exudate or posterior oropharyngeal erythema.  Cardiovascular:     Rate and Rhythm: Normal rate and regular rhythm.     Heart sounds: No murmur heard.    No friction rub. No gallop.  Pulmonary:     Effort: Pulmonary effort is normal. No respiratory distress.     Breath sounds: Normal breath sounds. No wheezing, rhonchi or rales.  Abdominal:     General: Bowel sounds are normal. There is no distension.     Palpations: Abdomen is soft. There is no mass.     Tenderness: There is no abdominal tenderness.  Musculoskeletal:         General: No swelling.     Right lower leg: No edema.     Left lower leg: No edema.  Lymphadenopathy:     Cervical: No cervical adenopathy.     Upper Body:     Right upper body: No supraclavicular or axillary adenopathy.     Left upper body: No supraclavicular or axillary adenopathy.     Lower Body: No right inguinal adenopathy. No left inguinal adenopathy.  Skin:    General: Skin is warm.     Coloration: Skin is not jaundiced.     Findings: No lesion or rash.  Neurological:     General: No focal deficit present.     Mental Status: He is alert and oriented to person, place, and time. Mental status is at baseline.  Psychiatric:        Mood and Affect: Mood normal.        Behavior: Behavior normal.        Thought Content: Thought content normal.    LABS:    Latest Reference Range & Units 10/25/21 10:20  CEA 0.0 - 4.7 ng/mL 1.4    ASSESSMENT & PLAN:  Assessment/Plan:  A 76 y.o. male with stage IIIB rectal cancer, status post neoadjuvant chemoradiation, a low anterior resection, and 3 cycles of adjuvant FOLFOX.  Based upon his physical exam today and recent CEA level, the patient  remains disease-free. Clinically, he appears to be doing well.  As that is the case, I will see him back in another 6 months for repeat clinical assessment.  The patient understands all the plans discussed today and is in agreement with them.    Becky Colan Macarthur Critchley, MD

## 2021-10-26 ENCOUNTER — Inpatient Hospital Stay (INDEPENDENT_AMBULATORY_CARE_PROVIDER_SITE_OTHER): Payer: Medicare Other | Admitting: Oncology

## 2021-10-26 ENCOUNTER — Other Ambulatory Visit: Payer: Self-pay | Admitting: Oncology

## 2021-10-26 VITALS — BP 144/74 | HR 78 | Temp 98.3°F | Resp 16 | Ht 66.0 in

## 2021-10-26 DIAGNOSIS — C20 Malignant neoplasm of rectum: Secondary | ICD-10-CM

## 2021-10-27 LAB — CEA: CEA: 1.4 ng/mL (ref 0.0–4.7)

## 2021-11-03 ENCOUNTER — Other Ambulatory Visit: Payer: Self-pay

## 2021-11-03 ENCOUNTER — Telehealth: Payer: Self-pay

## 2021-11-03 NOTE — Telephone Encounter (Signed)
Pt requesting a refill on Zofran. I will ask Dr Bobby Rumpf.

## 2021-11-06 MED ORDER — ONDANSETRON HCL 4 MG PO TABS: 4.0000 mg | ORAL_TABLET | Freq: Three times a day (TID) | ORAL | 0 refills | Status: DC | PRN

## 2022-04-24 ENCOUNTER — Inpatient Hospital Stay: Payer: 59 | Attending: Oncology

## 2022-04-24 DIAGNOSIS — Z85048 Personal history of other malignant neoplasm of rectum, rectosigmoid junction, and anus: Secondary | ICD-10-CM | POA: Insufficient documentation

## 2022-04-24 DIAGNOSIS — Z923 Personal history of irradiation: Secondary | ICD-10-CM | POA: Diagnosis not present

## 2022-04-24 DIAGNOSIS — Z9221 Personal history of antineoplastic chemotherapy: Secondary | ICD-10-CM | POA: Insufficient documentation

## 2022-04-24 DIAGNOSIS — C20 Malignant neoplasm of rectum: Secondary | ICD-10-CM

## 2022-04-25 LAB — CEA: CEA: 1.2 ng/mL (ref 0.0–4.7)

## 2022-04-25 NOTE — Progress Notes (Signed)
[facility]  [address] [place],  [place]  [address] [phone]  Clinic Day:  04/26/2022  Referring physician: Charlynn Court, NP  HISTORY OF PRESENT ILLNESS:  The patient is a 77 y.o. 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  He received neoadjuvant chemoradiation, followed by a laparoscopic low anterior resection in May 2020.  He underwent 3 cycles of adjuvant FOLFOX chemotherapy before the decision was made to discontinue it due to a progressive failure to thrive.  Since then, labs, a postsurgical colonoscopy, and scans have shown no evidence of disease recurrence.  He comes in today for routine follow up.   Since his last visit, the patient has been doing well.  He denies having any new GI symptoms which concern him for possible disease recurrence.  PHYSICAL EXAM:  Blood pressure 136/73, pulse 76, temperature 98.6 F (37 C), resp. rate 16, height '5\' 6"'$  (1.676 m), weight 155 lb 6.4 oz (70.5 kg), SpO2 91 %. Wt Readings from Last 3 Encounters:  04/26/22 155 lb 6.4 oz (70.5 kg)  04/25/21 165 lb 4.8 oz (75 kg)  10/26/20 166 lb (75.3 kg)   Body mass index is 25.08 kg/m. Performance status (ECOG): 1 - Symptomatic but completely ambulatory Physical Exam Constitutional:      Appearance: Normal appearance. He is not ill-appearing.  HENT:     Mouth/Throat:     Mouth: Mucous membranes are moist.     Pharynx: Oropharynx is clear. No oropharyngeal exudate or posterior oropharyngeal erythema.  Cardiovascular:     Rate and Rhythm: Normal rate and regular rhythm.     Heart sounds: No murmur heard.    No friction rub. No gallop.  Pulmonary:     Effort: Pulmonary effort is normal. No respiratory distress.     Breath sounds: Normal breath sounds. No wheezing, rhonchi or rales.  Abdominal:     General: Bowel sounds are normal. There is no distension.     Palpations: Abdomen is soft. There is no mass.     Tenderness: There is no abdominal tenderness.   Musculoskeletal:        General: No swelling.     Right lower leg: No edema.     Left lower leg: No edema.  Lymphadenopathy:     Cervical: No cervical adenopathy.     Upper Body:     Right upper body: No supraclavicular or axillary adenopathy.     Left upper body: No supraclavicular or axillary adenopathy.     Lower Body: No right inguinal adenopathy. No left inguinal adenopathy.  Skin:    General: Skin is warm.     Coloration: Skin is not jaundiced.     Findings: No lesion or rash.  Neurological:     General: No focal deficit present.     Mental Status: He is alert and oriented to person, place, and time. Mental status is at baseline.  Psychiatric:        Mood and Affect: Mood normal.        Behavior: Behavior normal.        Thought Content: Thought content normal.     LABS:    Latest Reference Range & Units 04/24/22 08:21  CEA 0.0 - 4.7 ng/mL 1.2    ASSESSMENT & PLAN:  Assessment/Plan:  A 77 y.o. male with stage IIIB rectal cancer, status post neoadjuvant chemoradiation, a low anterior resection, and 3 cycles of adjuvant FOLFOX.  Based upon his physical exam  today and recent CEA level, the patient remains disease-free. Clinically, he appears to be doing well.  As that is the case, I will see him back in another 6 months for repeat clinical assessment.  The patient understands all the plans discussed today and is in agreement with them.    Dequon Schnebly Macarthur Critchley, MD

## 2022-04-26 ENCOUNTER — Other Ambulatory Visit: Payer: Self-pay | Admitting: Oncology

## 2022-04-26 ENCOUNTER — Telehealth: Payer: Self-pay | Admitting: Oncology

## 2022-04-26 ENCOUNTER — Inpatient Hospital Stay (INDEPENDENT_AMBULATORY_CARE_PROVIDER_SITE_OTHER): Payer: 59 | Admitting: Oncology

## 2022-04-26 VITALS — BP 136/73 | HR 76 | Temp 98.6°F | Resp 16 | Ht 66.0 in | Wt 155.4 lb

## 2022-04-26 DIAGNOSIS — C20 Malignant neoplasm of rectum: Secondary | ICD-10-CM

## 2022-04-26 NOTE — Telephone Encounter (Signed)
Patient has been scheduled for follow-up visit per 04/26/22 LOS.  Pt given an appt calendar with date and time.

## 2022-08-27 DIAGNOSIS — Z6823 Body mass index (BMI) 23.0-23.9, adult: Secondary | ICD-10-CM | POA: Diagnosis not present

## 2022-08-27 DIAGNOSIS — E785 Hyperlipidemia, unspecified: Secondary | ICD-10-CM | POA: Diagnosis not present

## 2022-08-27 DIAGNOSIS — Z9181 History of falling: Secondary | ICD-10-CM | POA: Diagnosis not present

## 2022-08-27 DIAGNOSIS — J449 Chronic obstructive pulmonary disease, unspecified: Secondary | ICD-10-CM | POA: Diagnosis not present

## 2022-08-27 DIAGNOSIS — Z85038 Personal history of other malignant neoplasm of large intestine: Secondary | ICD-10-CM | POA: Diagnosis not present

## 2022-08-27 DIAGNOSIS — I10 Essential (primary) hypertension: Secondary | ICD-10-CM | POA: Diagnosis not present

## 2022-09-17 DIAGNOSIS — Z139 Encounter for screening, unspecified: Secondary | ICD-10-CM | POA: Diagnosis not present

## 2022-09-17 DIAGNOSIS — Z0000 Encounter for general adult medical examination without abnormal findings: Secondary | ICD-10-CM | POA: Diagnosis not present

## 2022-09-17 DIAGNOSIS — Z0189 Encounter for other specified special examinations: Secondary | ICD-10-CM | POA: Diagnosis not present

## 2022-09-17 DIAGNOSIS — Z1389 Encounter for screening for other disorder: Secondary | ICD-10-CM | POA: Diagnosis not present

## 2022-09-17 DIAGNOSIS — N1831 Chronic kidney disease, stage 3a: Secondary | ICD-10-CM | POA: Diagnosis not present

## 2022-09-17 DIAGNOSIS — J449 Chronic obstructive pulmonary disease, unspecified: Secondary | ICD-10-CM | POA: Diagnosis not present

## 2022-09-17 DIAGNOSIS — E781 Pure hyperglyceridemia: Secondary | ICD-10-CM | POA: Diagnosis not present

## 2022-09-17 DIAGNOSIS — Z136 Encounter for screening for cardiovascular disorders: Secondary | ICD-10-CM | POA: Diagnosis not present

## 2022-09-17 DIAGNOSIS — Z6824 Body mass index (BMI) 24.0-24.9, adult: Secondary | ICD-10-CM | POA: Diagnosis not present

## 2022-09-17 DIAGNOSIS — I10 Essential (primary) hypertension: Secondary | ICD-10-CM | POA: Diagnosis not present

## 2022-09-28 DIAGNOSIS — E7800 Pure hypercholesterolemia, unspecified: Secondary | ICD-10-CM | POA: Diagnosis not present

## 2022-09-28 DIAGNOSIS — Z8679 Personal history of other diseases of the circulatory system: Secondary | ICD-10-CM | POA: Diagnosis not present

## 2022-09-28 DIAGNOSIS — I10 Essential (primary) hypertension: Secondary | ICD-10-CM | POA: Diagnosis not present

## 2022-10-08 DIAGNOSIS — I10 Essential (primary) hypertension: Secondary | ICD-10-CM | POA: Diagnosis not present

## 2022-10-08 DIAGNOSIS — Z6824 Body mass index (BMI) 24.0-24.9, adult: Secondary | ICD-10-CM | POA: Diagnosis not present

## 2022-10-08 DIAGNOSIS — N1831 Chronic kidney disease, stage 3a: Secondary | ICD-10-CM | POA: Diagnosis not present

## 2022-10-08 DIAGNOSIS — Z139 Encounter for screening, unspecified: Secondary | ICD-10-CM | POA: Diagnosis not present

## 2022-10-24 ENCOUNTER — Inpatient Hospital Stay: Payer: 59 | Attending: Oncology

## 2022-10-24 DIAGNOSIS — R627 Adult failure to thrive: Secondary | ICD-10-CM | POA: Insufficient documentation

## 2022-10-24 DIAGNOSIS — Z923 Personal history of irradiation: Secondary | ICD-10-CM | POA: Diagnosis not present

## 2022-10-24 DIAGNOSIS — C20 Malignant neoplasm of rectum: Secondary | ICD-10-CM | POA: Insufficient documentation

## 2022-10-24 DIAGNOSIS — Z9221 Personal history of antineoplastic chemotherapy: Secondary | ICD-10-CM | POA: Diagnosis not present

## 2022-10-25 LAB — CEA: CEA: 1.6 ng/mL (ref 0.0–4.7)

## 2022-10-25 NOTE — Progress Notes (Signed)
[facility]  [address] [place],  [place]  [address] [phone]  Clinic Day:  10/26/2022  Referring physician: Simone Curia, MD  HISTORY OF PRESENT ILLNESS:  The patient is a 77 y.o. 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  He received neoadjuvant chemoradiation, followed by a laparoscopic low anterior resection in May 2020.  He underwent 3 cycles of adjuvant FOLFOX chemotherapy before the decision was made to discontinue it due to it causing a progressive failure to thrive.  Since then, labs, a postsurgical colonoscopy, and scans have shown no evidence of disease recurrence.  He comes in today for routine follow up.   Since his last visit, the patient has been doing well.  He denies having any new GI symptoms which concern him for possible disease recurrence.  PHYSICAL EXAM:  Blood pressure (!) 155/74, pulse 72, temperature 98 F (36.7 C), resp. rate 16, height 5\' 6"  (1.676 m), weight 158 lb (71.7 kg), SpO2 96%. Wt Readings from Last 3 Encounters:  10/26/22 158 lb (71.7 kg)  04/26/22 155 lb 6.4 oz (70.5 kg)  04/25/21 165 lb 4.8 oz (75 kg)   Body mass index is 25.5 kg/m. Performance status (ECOG): 1 - Symptomatic but completely ambulatory Physical Exam Constitutional:      Appearance: Normal appearance. He is not ill-appearing.     Comments: He is ambulating with a walker  HENT:     Mouth/Throat:     Mouth: Mucous membranes are moist.     Pharynx: Oropharynx is clear. No oropharyngeal exudate or posterior oropharyngeal erythema.  Cardiovascular:     Rate and Rhythm: Normal rate and regular rhythm.     Heart sounds: No murmur heard.    No friction rub. No gallop.  Pulmonary:     Effort: Pulmonary effort is normal. No respiratory distress.     Breath sounds: Normal breath sounds. No wheezing, rhonchi or rales.  Abdominal:     General: Bowel sounds are normal. There is no distension.     Palpations: Abdomen is soft. There is no mass.      Tenderness: There is no abdominal tenderness.  Musculoskeletal:        General: No swelling.     Right lower leg: No edema.     Left lower leg: No edema.  Lymphadenopathy:     Cervical: No cervical adenopathy.     Upper Body:     Right upper body: No supraclavicular or axillary adenopathy.     Left upper body: No supraclavicular or axillary adenopathy.     Lower Body: No right inguinal adenopathy. No left inguinal adenopathy.  Skin:    General: Skin is warm.     Coloration: Skin is not jaundiced.     Findings: No lesion or rash.  Neurological:     General: No focal deficit present.     Mental Status: He is alert and oriented to person, place, and time. Mental status is at baseline.  Psychiatric:        Mood and Affect: Mood normal.        Behavior: Behavior normal.        Thought Content: Thought content normal.     LABS:    Latest Reference Range & Units 10/24/22 09:21  CEA 0.0 - 4.7 ng/mL 1.6    ASSESSMENT & PLAN:  Assessment/Plan:  A 77 y.o. male with stage IIIB rectal cancer, status post neoadjuvant chemoradiation, a low anterior resection, and 3  cycles of adjuvant FOLFOX.  Based upon his physical exam today and recent CEA level, the patient remains disease-free. Clinically, he appears to be doing well.  As that is the case, I will see him back in May 2025 for repeat clinical assessment.  If he remains disease free at that time, it will mark him being 5 years cancer free for which I would consider him cured of his disease.  The patient understands all the plans discussed today and is in agreement with them.    Masiyah Engen Kirby Funk, MD

## 2022-10-26 ENCOUNTER — Other Ambulatory Visit: Payer: Self-pay | Admitting: Oncology

## 2022-10-26 ENCOUNTER — Inpatient Hospital Stay (INDEPENDENT_AMBULATORY_CARE_PROVIDER_SITE_OTHER): Payer: 59 | Admitting: Oncology

## 2022-10-26 ENCOUNTER — Telehealth: Payer: Self-pay | Admitting: Oncology

## 2022-10-26 VITALS — BP 155/74 | HR 72 | Temp 98.0°F | Resp 16 | Ht 66.0 in | Wt 158.0 lb

## 2022-10-26 DIAGNOSIS — C20 Malignant neoplasm of rectum: Secondary | ICD-10-CM | POA: Diagnosis not present

## 2022-10-26 NOTE — Telephone Encounter (Signed)
Patient has been scheduled. Aware of appt date and time.   Scheduling Message Entered by Rennis Harding A on 10/26/2022 at  9:42 AM Priority: Routine <No visit type provided>  Department: [facility]  Provider:  Scheduling Notes:  Labs 06-24-23  Appt 06-25-23

## 2022-11-27 DIAGNOSIS — R200 Anesthesia of skin: Secondary | ICD-10-CM | POA: Diagnosis not present

## 2022-11-27 DIAGNOSIS — Z6824 Body mass index (BMI) 24.0-24.9, adult: Secondary | ICD-10-CM | POA: Diagnosis not present

## 2022-12-13 DIAGNOSIS — R200 Anesthesia of skin: Secondary | ICD-10-CM | POA: Diagnosis not present

## 2022-12-21 DIAGNOSIS — R200 Anesthesia of skin: Secondary | ICD-10-CM | POA: Diagnosis not present

## 2023-01-21 DIAGNOSIS — N1831 Chronic kidney disease, stage 3a: Secondary | ICD-10-CM | POA: Diagnosis not present

## 2023-01-21 DIAGNOSIS — I10 Essential (primary) hypertension: Secondary | ICD-10-CM | POA: Diagnosis not present

## 2023-01-21 DIAGNOSIS — J449 Chronic obstructive pulmonary disease, unspecified: Secondary | ICD-10-CM | POA: Diagnosis not present

## 2023-06-24 ENCOUNTER — Inpatient Hospital Stay: Payer: 59 | Attending: Oncology

## 2023-06-24 DIAGNOSIS — Z923 Personal history of irradiation: Secondary | ICD-10-CM | POA: Diagnosis not present

## 2023-06-24 DIAGNOSIS — Z9221 Personal history of antineoplastic chemotherapy: Secondary | ICD-10-CM | POA: Insufficient documentation

## 2023-06-24 DIAGNOSIS — Z85048 Personal history of other malignant neoplasm of rectum, rectosigmoid junction, and anus: Secondary | ICD-10-CM | POA: Insufficient documentation

## 2023-06-24 DIAGNOSIS — C20 Malignant neoplasm of rectum: Secondary | ICD-10-CM

## 2023-06-24 LAB — CEA (ACCESS): CEA (CHCC): 1.5 ng/mL (ref 0.00–5.00)

## 2023-06-24 NOTE — Progress Notes (Unsigned)
 [facility]  [address] [place],  [place]  [address] [phone]  Clinic Day:  06/25/2023  Referring physician: Barbar Levine, MD  HISTORY OF PRESENT ILLNESS:  The patient is a 78 y.o. 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  He received neoadjuvant chemoradiation, followed by a laparoscopic low anterior resection in May 2020.  He underwent 3 cycles of adjuvant FOLFOX chemotherapy before the decision was made to discontinue it due to it causing a progressive failure to thrive.  Fortunately, since then, labs, a postsurgical colonoscopy, and scans have shown no evidence of disease recurrence.  He comes in today for routine follow up.   Since his last visit, the patient has been doing well.  He denies having any new GI symptoms which concern him for possible disease recurrence.  PHYSICAL EXAM:  Blood pressure 95/63, pulse (!) 112, temperature 98.3 F (36.8 C), temperature source Oral, resp. rate 20, height 5\' 6"  (1.676 m), weight 139 lb 12.8 oz (63.4 kg), SpO2 95%. Wt Readings from Last 3 Encounters:  06/25/23 139 lb 12.8 oz (63.4 kg)  10/26/22 158 lb (71.7 kg)  04/26/22 155 lb 6.4 oz (70.5 kg)   Body mass index is 22.56 kg/m. Performance status (ECOG): 1 - Symptomatic but completely ambulatory Physical Exam Constitutional:      Appearance: Normal appearance. He is not ill-appearing.     Comments: He is ambulating with a cane  HENT:     Mouth/Throat:     Mouth: Mucous membranes are moist.     Pharynx: Oropharynx is clear. No oropharyngeal exudate or posterior oropharyngeal erythema.  Cardiovascular:     Rate and Rhythm: Normal rate and regular rhythm.     Heart sounds: No murmur heard.    No friction rub. No gallop.  Pulmonary:     Effort: Pulmonary effort is normal. No respiratory distress.     Breath sounds: Normal breath sounds. No wheezing, rhonchi or rales.  Abdominal:     General: Bowel sounds are normal. There is no  distension.     Palpations: Abdomen is soft. There is no mass.     Tenderness: There is no abdominal tenderness.  Musculoskeletal:        General: No swelling.     Right lower leg: No edema.     Left lower leg: No edema.  Lymphadenopathy:     Cervical: No cervical adenopathy.     Upper Body:     Right upper body: No supraclavicular or axillary adenopathy.     Left upper body: No supraclavicular or axillary adenopathy.     Lower Body: No right inguinal adenopathy. No left inguinal adenopathy.  Skin:    General: Skin is warm.     Coloration: Skin is not jaundiced.     Findings: No lesion or rash.  Neurological:     General: No focal deficit present.     Mental Status: He is alert and oriented to person, place, and time. Mental status is at baseline.  Psychiatric:        Mood and Affect: Mood normal.        Behavior: Behavior normal.        Thought Content: Thought content normal.    LABS:    Latest Reference Range & Units 06/24/23 10:00  CEA ([facility]) 0.00 - 5.00 ng/mL 1.50    ASSESSMENT & PLAN:  Assessment/Plan:  A 78 y.o. male with stage IIIB rectal cancer, status post neoadjuvant chemoradiation,  a low anterior resection, and 3 cycles of adjuvant FOLFOX.  Based upon his physical exam today and recent CEA level, the patient remains disease-free. Clinically, he appears to be doing well.  As he is 5 years cancer free, I now consider him cured of his disease.  Understandably, the patient was pleased with this news.  I do feel comfortable turning his care back over to his other physicians.  However, I would have no problem seeing him in the future if any new hematologic or oncologic issues arise that require repeat clinical assessment.  The patient understands all the plans discussed today and is in agreement with them.    Michella Detjen Felicia Horde, MD

## 2023-06-25 ENCOUNTER — Other Ambulatory Visit: Payer: Self-pay

## 2023-06-25 ENCOUNTER — Inpatient Hospital Stay (HOSPITAL_BASED_OUTPATIENT_CLINIC_OR_DEPARTMENT_OTHER): Payer: 59 | Admitting: Oncology

## 2023-06-25 VITALS — BP 95/63 | HR 112 | Temp 98.3°F | Resp 20 | Ht 66.0 in | Wt 139.8 lb

## 2023-06-25 DIAGNOSIS — C20 Malignant neoplasm of rectum: Secondary | ICD-10-CM | POA: Diagnosis not present

## 2023-06-25 DIAGNOSIS — Z85048 Personal history of other malignant neoplasm of rectum, rectosigmoid junction, and anus: Secondary | ICD-10-CM | POA: Diagnosis not present

## 2023-07-02 ENCOUNTER — Inpatient Hospital Stay: Admitting: Dietician

## 2023-07-02 NOTE — Progress Notes (Signed)
 Nutrition Assessment   Reason for Assessment: MST screen for weight loss.    ASSESSMENT: The patient is a 78 y.o. 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  He received neoadjuvant chemoradiation, followed by a laparoscopic low anterior resection in May 2020.  He underwent 3 cycles of adjuvant FOLFOX chemotherapy before the decision was made to discontinue it due to it causing a progressive failure to thrive.  Patient now 5 years cancer free.  Patient reports he has an appointment with PCP next month to address recent weight loss.  Patient admits he isn't eating much has had taste changes that have lingered even years after treatments ended. He has started using Equate (sometime sugar free since his brother has blood glucose concerns). Doesn't eat much meat or vegetables. Will eat some [employer] calendar frozen meals and usually eats 1-2 times a day. Likes cheese grits and oatmeal some days but doesn't eat breakfast daily.   He is taking a MVI daily.   He is concerned that he has no LBM and looks like a skeleton.     Anthropometrics: Reports weight loss past 4-5 months. EMR reflects 18.3# (11.58%) past 8 months significant for timeframe.  Height: 66" Weight:  06/27/23  139.7# UBW: 155-165# BMI: 22.56    NUTRITION DIAGNOSIS: Inadequate PO intake to meet increased nutrient needs, r/t emphysema   MALNUTRITION DIAGNOSIS: Suspect sever malnutrition of acute onset with recent significant weight loss and reports loss of LBM by patient   INTERVENTION:  Relayed that nutrition services are wrap around service provided at no charge and encouraged continued communication if experiencing continued weight loss or any nutritional impact symptoms (NIS). Educated on importance of adequate calorie and protein energy intake  with nutrient dense foods when possible to maintain weight/strength Encouraged small frequent feeds and trying to eat 6 small meals Suggested increasing oral nutrition  supplements to 3-4 per day Encouraged buying a scale or going somewhere monthly to weight himself and monitor progress Mailed Nutrition Tip sheet  for  High Protein High Calorie Snacking with contact information provided   MONITORING, EVALUATION, GOAL: weight, PO intake, Nutrition Impact Symptoms, labs  Goal is weight maintenance or slow and steady regain 2-4# per month  Next Visit: PRN at patient or provider request  Carleen Chary, RDN, LDN Registered Dietitian, [facility] Part Time Remote (Usual office hours: Tuesday-Thursday) Cell: [phone]

## 2023-07-12 DIAGNOSIS — R000 Tachycardia, unspecified: Secondary | ICD-10-CM | POA: Diagnosis not present

## 2023-07-12 DIAGNOSIS — I4892 Unspecified atrial flutter: Secondary | ICD-10-CM | POA: Diagnosis not present

## 2023-07-13 DIAGNOSIS — I4892 Unspecified atrial flutter: Secondary | ICD-10-CM | POA: Diagnosis not present

## 2023-07-13 DIAGNOSIS — I4891 Unspecified atrial fibrillation: Secondary | ICD-10-CM | POA: Diagnosis not present

## 2023-07-13 DIAGNOSIS — J440 Chronic obstructive pulmonary disease with acute lower respiratory infection: Secondary | ICD-10-CM | POA: Diagnosis not present

## 2023-07-13 DIAGNOSIS — I340 Nonrheumatic mitral (valve) insufficiency: Secondary | ICD-10-CM | POA: Diagnosis not present

## 2023-07-13 DIAGNOSIS — N189 Chronic kidney disease, unspecified: Secondary | ICD-10-CM | POA: Diagnosis not present

## 2023-07-13 DIAGNOSIS — I361 Nonrheumatic tricuspid (valve) insufficiency: Secondary | ICD-10-CM | POA: Diagnosis not present

## 2023-07-14 DIAGNOSIS — N189 Chronic kidney disease, unspecified: Secondary | ICD-10-CM | POA: Diagnosis not present

## 2023-07-14 DIAGNOSIS — I429 Cardiomyopathy, unspecified: Secondary | ICD-10-CM | POA: Diagnosis not present

## 2023-07-14 DIAGNOSIS — I4891 Unspecified atrial fibrillation: Secondary | ICD-10-CM | POA: Diagnosis not present

## 2023-07-15 DIAGNOSIS — E876 Hypokalemia: Secondary | ICD-10-CM | POA: Diagnosis not present

## 2023-07-15 DIAGNOSIS — I4892 Unspecified atrial flutter: Secondary | ICD-10-CM | POA: Diagnosis not present

## 2023-07-15 DIAGNOSIS — I959 Hypotension, unspecified: Secondary | ICD-10-CM | POA: Diagnosis not present

## 2023-07-15 DIAGNOSIS — I5033 Acute on chronic diastolic (congestive) heart failure: Secondary | ICD-10-CM | POA: Diagnosis not present

## 2023-07-16 DIAGNOSIS — I509 Heart failure, unspecified: Secondary | ICD-10-CM | POA: Diagnosis not present

## 2023-07-16 DIAGNOSIS — I4892 Unspecified atrial flutter: Secondary | ICD-10-CM | POA: Diagnosis not present

## 2023-07-16 DIAGNOSIS — I429 Cardiomyopathy, unspecified: Secondary | ICD-10-CM | POA: Diagnosis not present

## 2023-07-16 DIAGNOSIS — I959 Hypotension, unspecified: Secondary | ICD-10-CM | POA: Diagnosis not present

## 2023-07-17 DIAGNOSIS — I509 Heart failure, unspecified: Secondary | ICD-10-CM | POA: Diagnosis not present

## 2023-07-17 DIAGNOSIS — I4892 Unspecified atrial flutter: Secondary | ICD-10-CM | POA: Diagnosis not present

## 2023-07-17 DIAGNOSIS — I429 Cardiomyopathy, unspecified: Secondary | ICD-10-CM | POA: Diagnosis not present

## 2023-07-17 DIAGNOSIS — I959 Hypotension, unspecified: Secondary | ICD-10-CM | POA: Diagnosis not present

## 2023-07-18 DIAGNOSIS — I509 Heart failure, unspecified: Secondary | ICD-10-CM | POA: Diagnosis not present

## 2023-07-18 DIAGNOSIS — I4892 Unspecified atrial flutter: Secondary | ICD-10-CM | POA: Diagnosis not present

## 2023-07-18 DIAGNOSIS — I429 Cardiomyopathy, unspecified: Secondary | ICD-10-CM | POA: Diagnosis not present

## 2023-07-18 DIAGNOSIS — I959 Hypotension, unspecified: Secondary | ICD-10-CM | POA: Diagnosis not present

## 2023-07-19 DIAGNOSIS — I509 Heart failure, unspecified: Secondary | ICD-10-CM | POA: Diagnosis not present

## 2023-07-19 DIAGNOSIS — I959 Hypotension, unspecified: Secondary | ICD-10-CM | POA: Diagnosis not present

## 2023-07-19 DIAGNOSIS — I429 Cardiomyopathy, unspecified: Secondary | ICD-10-CM | POA: Diagnosis not present

## 2023-07-19 DIAGNOSIS — I4892 Unspecified atrial flutter: Secondary | ICD-10-CM | POA: Diagnosis not present

## 2023-12-14 DEATH — deceased
